# Patient Record
Sex: Male | Born: 1975 | Race: Black or African American | Hispanic: No | Marital: Married | Smoking: Never smoker
Health system: Southern US, Community
[De-identification: ages and names within clinical notes are randomized; demographics above are authoritative.]

## PROBLEM LIST (undated history)

## (undated) DIAGNOSIS — G4733 Obstructive sleep apnea (adult) (pediatric): Secondary | ICD-10-CM

## (undated) DIAGNOSIS — F32A Depression, unspecified: Secondary | ICD-10-CM

## (undated) DIAGNOSIS — F329 Major depressive disorder, single episode, unspecified: Secondary | ICD-10-CM

## (undated) DIAGNOSIS — G43909 Migraine, unspecified, not intractable, without status migrainosus: Secondary | ICD-10-CM

## (undated) DIAGNOSIS — G43109 Migraine with aura, not intractable, without status migrainosus: Principal | ICD-10-CM

## (undated) DIAGNOSIS — I10 Essential (primary) hypertension: Secondary | ICD-10-CM

## (undated) HISTORY — PX: SKIN BIOPSY: SHX1

## (undated) HISTORY — DX: Obstructive sleep apnea (adult) (pediatric): G47.33

## (undated) HISTORY — DX: Migraine with aura, not intractable, without status migrainosus: G43.109

## (undated) HISTORY — PX: SHOULDER SURGERY: SHX246

---

## 2009-05-01 ENCOUNTER — Inpatient Hospital Stay (HOSPITAL_COMMUNITY): Admission: EM | Admit: 2009-05-01 | Discharge: 2009-05-04 | Payer: Self-pay | Admitting: Emergency Medicine

## 2009-05-02 ENCOUNTER — Ambulatory Visit: Payer: Self-pay | Admitting: Infectious Diseases

## 2011-03-12 LAB — CBC
HCT: 37.2 % — ABNORMAL LOW (ref 39.0–52.0)
Hemoglobin: 12.4 g/dL — ABNORMAL LOW (ref 13.0–17.0)
MCHC: 33.2 g/dL (ref 30.0–36.0)
MCV: 87.3 fL (ref 78.0–100.0)
Platelets: 243 10*3/uL (ref 150–400)
RBC: 4.26 MIL/uL (ref 4.22–5.81)
RDW: 13.4 % (ref 11.5–15.5)
WBC: 11.8 10*3/uL — ABNORMAL HIGH (ref 4.0–10.5)

## 2011-03-12 LAB — DIFFERENTIAL
Basophils Absolute: 0.1 10*3/uL (ref 0.0–0.1)
Basophils Relative: 1 % (ref 0–1)
Eosinophils Absolute: 0.2 10*3/uL (ref 0.0–0.7)
Eosinophils Relative: 2 % (ref 0–5)
Lymphocytes Relative: 20 % (ref 12–46)
Lymphs Abs: 2.3 10*3/uL (ref 0.7–4.0)
Monocytes Absolute: 0.8 10*3/uL (ref 0.1–1.0)
Monocytes Relative: 7 % (ref 3–12)
Neutro Abs: 8.4 10*3/uL — ABNORMAL HIGH (ref 1.7–7.7)
Neutrophils Relative %: 71 % (ref 43–77)

## 2011-03-12 LAB — GC/CHLAMYDIA PROBE AMP, URINE
Chlamydia, Swab/Urine, PCR: NEGATIVE
GC Probe Amp, Urine: NEGATIVE

## 2011-03-12 LAB — HIV 1/2 CONFIRMATION
HIV-1 antibody: NEGATIVE
HIV-2 Ab: NEGATIVE

## 2011-03-12 LAB — VANCOMYCIN, TROUGH: Vancomycin Tr: 15.7 ug/mL (ref 10.0–20.0)

## 2011-03-13 LAB — CBC
HCT: 41.2 % (ref 39.0–52.0)
Hemoglobin: 13.5 g/dL (ref 13.0–17.0)
MCHC: 32.8 g/dL (ref 30.0–36.0)
MCV: 85.3 fL (ref 78.0–100.0)
Platelets: 244 10*3/uL (ref 150–400)
RBC: 4.83 MIL/uL (ref 4.22–5.81)
RDW: 12.4 % (ref 11.5–15.5)
WBC: 13.4 10*3/uL — ABNORMAL HIGH (ref 4.0–10.5)

## 2011-03-13 LAB — CULTURE, BLOOD (ROUTINE X 2): Culture: NO GROWTH

## 2011-03-13 LAB — DIFFERENTIAL
Basophils Absolute: 0.3 10*3/uL — ABNORMAL HIGH (ref 0.0–0.1)
Basophils Relative: 2 % — ABNORMAL HIGH (ref 0–1)
Eosinophils Absolute: 0.1 10*3/uL (ref 0.0–0.7)
Eosinophils Relative: 1 % (ref 0–5)
Lymphocytes Relative: 16 % (ref 12–46)
Lymphs Abs: 2.2 10*3/uL (ref 0.7–4.0)
Monocytes Absolute: 1.1 10*3/uL — ABNORMAL HIGH (ref 0.1–1.0)
Monocytes Relative: 8 % (ref 3–12)
Neutro Abs: 9.7 10*3/uL — ABNORMAL HIGH (ref 1.7–7.7)
Neutrophils Relative %: 72 % (ref 43–77)

## 2011-03-13 LAB — SEDIMENTATION RATE: Sed Rate: 8 mm/hr (ref 0–16)

## 2011-03-13 LAB — BODY FLUID CULTURE: Culture: NO GROWTH

## 2011-03-13 LAB — BASIC METABOLIC PANEL
BUN: 8 mg/dL (ref 6–23)
CO2: 32 mEq/L (ref 19–32)
Calcium: 9.1 mg/dL (ref 8.4–10.5)
Chloride: 98 mEq/L (ref 96–112)
Creatinine, Ser: 0.9 mg/dL (ref 0.4–1.5)
GFR calc Af Amer: >60 mL/min (ref >60)
GFR calc non Af Amer: >60 mL/min (ref >60)
Glucose, Bld: 102 mg/dL — ABNORMAL HIGH (ref 70–99)
Potassium: 3.8 mEq/L (ref 3.5–5.1)
Sodium: 142 mEq/L (ref 135–145)

## 2011-03-13 LAB — SYNOVIAL CELL COUNT + DIFF, W/ CRYSTALS
Crystals, Fluid: NONE SEEN
Lymphocytes-Synovial Fld: 0 % (ref 0–20)
Monocyte-Macrophage-Synovial Fluid: 2 % — ABNORMAL LOW (ref 50–90)
Neutrophil, Synovial: 98 % — ABNORMAL HIGH (ref 0–25)
WBC, Synovial: 34000 /mm3 — ABNORMAL HIGH (ref 0–200)

## 2011-03-13 LAB — ANAEROBIC CULTURE

## 2011-04-17 NOTE — H&P (Signed)
NAME:  INEZ, ROSATO NO.:  [id number]   MEDICAL RECORD NO.:  [id number]          PATIENT TYPE:  EMS   LOCATION:  ED                           FACILITY:  [facility]   PHYSICIAN:  Burnard Bunting, M.D.    DATE OF BIRTH:  1976-01-20   DATE OF ADMISSION:  05/01/2009  DATE OF DISCHARGE:                              HISTORY & PHYSICAL   CHIEF COMPLAINT:  Right shoulder pain.   HISTORY OF PRESENT ILLNESS:  Johnathan Steele is a 35 year old right-  hand-dominant patient with a 2-day history of right shoulder pain and  several day history of some tooth pain on the left-hand side.  He denies  any fever or chills or trauma to the right shoulder region.  He  describes a gradually increasing pain in the right shoulder region which  has become more severe.  He denies any history of gout.  He states that  the shoulder range of motion although painful is possible.   PAST MEDICAL HISTORY:  Known for sleep apnea.   PAST SURGICAL HISTORY:  None.   The patient is nonsmoker, nondrinker.  No history of drug abuse.  He has  had his tetanus within 5 years.   HE HAS NO KNOWN DRUG ALLERGIES.   He was seen over at the [facility] where Toradol and  Ancef was given.  Fourteen other systems are reviewed and negative.  He  is a Psychologist, occupational.  He is married and has a daughter.   EXAM:  He is well-developed, well-nourished, no acute distress.  Alert  and oriented.  Blood pressure 140/74, pulse is 90, respirations 16,  temperature is 98.2.  CHEST:  Clear to auscultation.  HEART:  Regular rate and rhythm.  ABDOMINAL:  Benign.  RIGHT SHOULDER:  Demonstrates about a 10 cm diameter area of cellulitis  over the anterolateral aspect of the acromion.  There is no AC joint  tenderness.  Shoulder range of motion is mildly tender, rotator cuff  strength is intact, radial pulse 2+ out of 4.  There is no cervical  lymphadenopathy.  NECK:  Range of motion is full.  No tissue crepitus or  scapular  dyskinesia.   His white count is 13,000, this was done also at [facility].  Plain x-  rays are unremarkable.  MRI scan shows soft tissue edema surrounding the  shoulder compatible with cellulitis, no evidence of deep abscess.  There  is a small to moderate shoulder joint effusion which is nonspecific with  mild capsular extravasation anteriorly along the scapular body.  No  intra-articular pathology is evident.  The scan is somewhat limited  because of the patient's large size.  There is no history of gout in  this patient.   IMPRESSION:  Possible right shoulder infection.   PLAN:  Ancef was given at the [facility], this will  adversely affect our ability to obtain positive cultures.  Nonetheless  he does have a white count, shoulder effusion and cellulitis in the  anterior aspect the shoulder.  Plan at this time is for shoulder  arthroscopy and debridement of the  intra-articular space.  I believe  that the posterior  and anterior portals can be achieved without going through the  cellulitic area.  Risks and benefits of the surgery are discussed with  the patient including possible long-term adverse outcome of the shoulder  joint if infection persists.  All other questions are answered.  Plan on  diagnostic  __________ arthroscopy with ID consult.      Burnard Bunting, M.D.  Electronically Signed     GSD/MEDQ  D:  05/01/2009  T:  05/01/2009  Job:  [id number]

## 2011-04-17 NOTE — Op Note (Signed)
Johnathan Steele, Johnathan Steele          ACCOUNT NO.:  [id number]   MEDICAL RECORD NO.:  [id number]          PATIENT TYPE:  INP   LOCATION:  1539                         FACILITY:  [facility]   PHYSICIAN:  Burnard Bunting, M.D.    DATE OF BIRTH:  1976/01/22   DATE OF PROCEDURE:  05/01/2009  DATE OF DISCHARGE:                               OPERATIVE REPORT   PREOPERATIVE DIAGNOSIS:  Right shoulder infection.   POSTOPERATIVE DIAGNOSIS:  Right shoulder infection.   PROCEDURE:  Right shoulder arthroscopy and debridement.   SURGEON ATTENDING:  Burnard Bunting, MD.   ASSISTANT:  None.   ANESTHESIA:  General.   INDICATIONS:  Less Woolsey is a 35 year old patient with a  traumatic onset of right shoulder pain with a white count of 13,000 and  a moderate fluid collection in the shoulder joint by MRI scanning.  He  presents now for operative management.   OPERATIVE FINDINGS:  Examination under anesthesia:  The patient had a  pretty full range of motion.   DIAGNOSTIC ARTHROSCOPY:  1. Cloudy fluid within the glenohumeral joint.  2. Synovitis within the rotator interval.  3. Intact rotator cuff, articular cartilage, and biceps tendon.   PROCEDURE IN DETAIL:  The patient was brought to operating room where  general anesthesia was induced.  The patient was placed in the beach  chair position with the head in neutral position, right arm and shoulder  were prepped with Hibiclens and saline and draped in a sterile manner.   The anatomy of the shoulder was identified and seen to pressure lie on  the anterior margin of the acromion.  The scope was placed in through  the posterior portal into the glenohumeral joint.  Cloudy fluid was  encountered and this was sent for aerobic and anaerobic culture, gram  stain, cell count, and crystals.  The anterior portal was then created  under direct visualization and diagnostic arthroscopy was performed.  Cloudy fluid was again encountered within the  glenohumeral joint.  The  rotator cuff and biceps tendon were intact.  Synovitis was present in  the rotator interval and this was debrided.  The glenohumeral articular  surfaces were otherwise intact.  Six liters of irrigating solution  were placed through the joint.  A Hemovac drain was then placed after  debridement and irrigation.  This drain was placed through the anterior  portal.  The portals were then closed using 3-0 nylon.  Dressings and a  sling were applied.  The patient tolerated well without any immediate  complication.      Burnard Bunting, M.D.  Electronically Signed     GSD/MEDQ  D:  05/01/2009  T:  05/01/2009  Job:  [id number]

## 2011-04-20 NOTE — Discharge Summary (Signed)
[name], Johnathan Steele          ACCOUNT NO.:  [id number]   MEDICAL RECORD NO.:  [id number]          PATIENT TYPE:  INP   LOCATION:  1539                         [facility]   PHYSICIAN:  Burnard Bunting, M.D.    DATE OF BIRTH:  1976/03/29   DATE OF ADMISSION:  05/01/2009  DATE OF DISCHARGE:  05/04/2009                               DISCHARGE SUMMARY   DISCHARGE DIAGNOSIS:  Right shoulder infection.   SECONDARY DIAGNOSIS:  Hypertension.   OPERATIONS AND PROCEDURES:  Right shoulder arthroscopy and debridement  performed May 01, 2009.   HOSPITAL COURSE:  Merland Holness is a 35 year old patient with  spontaneous infection of the right shoulder.  He was admitted to the  orthopedic service May 01, 2009, at that time aspiration of the shoulder  joint.  At that time the patient had white count and effusion in the  shoulder consistent with infection.  He was admitted to the orthopedic  service, taken to the OR where right shoulder arthroscopy and  debridement was performed.  He was seen by infectious disease.  Cultures  showed Gram-positive cocci consistent with probable Staph aureus.  PICC  line was placed.  ID recommended 4 weeks of IV vancomycin which was  started.  He was seen by physical therapy, started pendulum exercises.  Incisions were intact at the time of discharge.  He otherwise had an  unremarkable recovery.  He was discharged home in good condition May 04, 2009.   DISCHARGE MEDICATIONS:  1. Vancomycin IV per pharmacy protocol.  2. Percocet 5/325 one p.o. q.3 - 4 hours p.r.n. pain.  3. Hydrochlorothiazide. 25 mg p.o. daily as treatment of blood      pressure for hypertension.   The patient will follow up with me in 7 days for suture removal.  He is  discharged in good condition.      Burnard Bunting, M.D.  Electronically Signed     GSD/MEDQ  D:  06/30/2009  T:  06/30/2009  Job:  [id number]

## 2011-09-28 ENCOUNTER — Emergency Department (HOSPITAL_BASED_OUTPATIENT_CLINIC_OR_DEPARTMENT_OTHER)
Admission: EM | Admit: 2011-09-28 | Discharge: 2011-09-28 | Disposition: A | Discharge status: Home / Self Care | Payer: Self-pay | Attending: Emergency Medicine | Admitting: Emergency Medicine

## 2011-09-28 ENCOUNTER — Encounter: Payer: Self-pay | Admitting: Family Medicine

## 2011-09-28 DIAGNOSIS — K089 Disorder of teeth and supporting structures, unspecified: Secondary | ICD-10-CM | POA: Insufficient documentation

## 2011-09-28 DIAGNOSIS — I10 Essential (primary) hypertension: Secondary | ICD-10-CM | POA: Insufficient documentation

## 2011-09-28 DIAGNOSIS — K0889 Other specified disorders of teeth and supporting structures: Secondary | ICD-10-CM

## 2011-09-28 DIAGNOSIS — Z79899 Other long term (current) drug therapy: Secondary | ICD-10-CM | POA: Insufficient documentation

## 2011-09-28 HISTORY — DX: Migraine, unspecified, not intractable, without status migrainosus: G43.909

## 2011-09-28 HISTORY — DX: Essential (primary) hypertension: I10

## 2011-09-28 MED ORDER — PENICILLIN V POTASSIUM 500 MG PO TABS: 500.0000 mg | ORAL_TABLET | Freq: Three times a day (TID) | ORAL | Status: AC

## 2011-09-28 MED ORDER — HYDROCODONE-ACETAMINOPHEN 5-325 MG PO TABS: 2.0000 | ORAL_TABLET | ORAL | Status: AC | PRN

## 2011-09-28 NOTE — ED Notes (Signed)
MD at bedside. 

## 2011-09-28 NOTE — ED Provider Notes (Signed)
History     CSN: [id number] Arrival date & time: 09/28/2011  9:23 AM   First MD Initiated Contact with Patient 09/28/11 0935      Chief Complaint  Patient presents with  . Dental Pain    (Consider location/radiation/quality/duration/timing/severity/associated sxs/prior treatment) HPI Complains of toothache left side upper first premolar tooth for the company swelling around the tooth onset 2 days ago treated with ibuprofen no relief pain is nonradiating sharp no other associated symptoms Past Medical History  Diagnosis Date  . Hypertension   . Migraines     Past Surgical History  Procedure Date  . Shoulder surgery   . Skin biopsy     No family history on file.  History  Substance Use Topics  . Smoking status: Never Smoker   . Smokeless tobacco: Not on file  . Alcohol Use: No      Review of Systems  Constitutional: Negative.   HENT: Negative.        Toothache  Respiratory: Negative.   Cardiovascular: Negative.   Gastrointestinal: Negative.   Musculoskeletal: Negative.   Skin: Negative.   Neurological: Negative.   Hematological: Negative.   Psychiatric/Behavioral: Negative.     Allergies  Review of patient's allergies indicates no known allergies.  Home Medications   Current Outpatient Rx  Name Route Sig Dispense Refill  . HYDROCHLOROTHIAZIDE 25 MG PO TABS Oral Take 25 mg by mouth daily.      . IBUPROFEN 200 MG PO TABS Oral Take 400 mg by mouth every 8 (eight) hours as needed. For headache     . METOPROLOL TARTRATE 100 MG PO TABS Oral Take 100 mg by mouth 2 (two) times daily.      . TOPIRAMATE 100 MG PO TABS Oral Take 100 mg by mouth daily.      . TOPIRAMATE 100 MG PO TABS Oral Take 200 mg by mouth at bedtime.      . TRAMADOL HCL 50 MG PO TABS Oral Take 50 mg by mouth every 6 (six) hours as needed. Maximum dose= 8 tablets per day    For migrains       BP 140/81  Pulse 84  Temp(Src) 97.9 F (36.6 C) (Oral)  Resp 16  Ht 6\' 1"  (1.854 m)  Wt 320 lb  (145.151 kg)  BMI 42.22 kg/m2  SpO2 100%  Physical Exam  Nursing note and vitals reviewed. Constitutional: He appears well-developed and well-nourished.  HENT:  Head: Normocephalic and atraumatic.       No gingival swelling or fluctuance. No trismus  Eyes: Conjunctivae are normal. Pupils are equal, round, and reactive to light.  Neck: Neck supple. No tracheal deviation present. No thyromegaly present.  Cardiovascular: Normal rate and regular rhythm.   No murmur heard. Pulmonary/Chest: Effort normal and breath sounds normal.  Abdominal: Soft. Bowel sounds are normal. He exhibits no distension. There is no tenderness.  Musculoskeletal: Normal range of motion. He exhibits no edema and no tenderness.  Neurological: He is alert. Coordination normal.  Skin: Skin is warm and dry. No rash noted.  Psychiatric: He has a normal mood and affect.    ED Course  Procedures (including critical care time)  Labs Reviewed - No data to display No results found.   No diagnosis found.    MDM  Plan prescription hydrocodone-A. Pap, Pen-Vee K dental followup next week blood pressure rechecked within 3 weeks Diagnosis #1 toothache #2 hypertension     Doug Sou, MD 09/28/11 1106

## 2011-09-28 NOTE — ED Notes (Signed)
Pt c/o toothache to left upper and left facial pain x 2 days.

## 2011-10-11 ENCOUNTER — Emergency Department (HOSPITAL_BASED_OUTPATIENT_CLINIC_OR_DEPARTMENT_OTHER)
Admission: EM | Admit: 2011-10-11 | Discharge: 2011-10-11 | Disposition: A | Discharge status: Home / Self Care | Payer: Self-pay | Attending: Emergency Medicine | Admitting: Emergency Medicine

## 2011-10-11 ENCOUNTER — Encounter (HOSPITAL_BASED_OUTPATIENT_CLINIC_OR_DEPARTMENT_OTHER): Payer: Self-pay | Admitting: Family Medicine

## 2011-10-11 ENCOUNTER — Other Ambulatory Visit: Payer: Self-pay

## 2011-10-11 DIAGNOSIS — Z79899 Other long term (current) drug therapy: Secondary | ICD-10-CM | POA: Insufficient documentation

## 2011-10-11 DIAGNOSIS — R42 Dizziness and giddiness: Secondary | ICD-10-CM | POA: Insufficient documentation

## 2011-10-11 DIAGNOSIS — R112 Nausea with vomiting, unspecified: Secondary | ICD-10-CM | POA: Insufficient documentation

## 2011-10-11 DIAGNOSIS — R51 Headache: Secondary | ICD-10-CM | POA: Insufficient documentation

## 2011-10-11 LAB — CBC
HCT: 44.1 % (ref 39.0–52.0)
Hemoglobin: 14.7 g/dL (ref 13.0–17.0)
MCH: 27.8 pg (ref 26.0–34.0)
MCHC: 33.3 g/dL (ref 30.0–36.0)
MCV: 83.5 fL (ref 78.0–100.0)
Platelets: 295 10*3/uL (ref 150–400)
RBC: 5.28 MIL/uL (ref 4.22–5.81)
RDW: 13.3 % (ref 11.5–15.5)
WBC: 10 10*3/uL (ref 4.0–10.5)

## 2011-10-11 LAB — URINALYSIS, ROUTINE W REFLEX MICROSCOPIC
Bilirubin Urine: NEGATIVE
Glucose, UA: NEGATIVE mg/dL
Hgb urine dipstick: NEGATIVE
Ketones, ur: NEGATIVE mg/dL
Leukocytes, UA: NEGATIVE
Nitrite: NEGATIVE
Protein, ur: 30 mg/dL — AB
Specific Gravity, Urine: 1.016 (ref 1.005–1.030)
Urobilinogen, UA: 0.2 mg/dL (ref 0.0–1.0)
pH: 8 (ref 5.0–8.0)

## 2011-10-11 LAB — COMPREHENSIVE METABOLIC PANEL
ALT: 81 U/L — ABNORMAL HIGH (ref 0–53)
AST: 54 U/L — ABNORMAL HIGH (ref 0–37)
Albumin: 4.3 g/dL (ref 3.5–5.2)
Alkaline Phosphatase: 90 U/L (ref 39–117)
BUN: 7 mg/dL (ref 6–23)
CO2: 26 mEq/L (ref 19–32)
Calcium: 10.1 mg/dL (ref 8.4–10.5)
Chloride: 101 mEq/L (ref 96–112)
Creatinine, Ser: 0.7 mg/dL (ref 0.50–1.35)
GFR calc Af Amer: >90 mL/min (ref >90)
GFR calc non Af Amer: >90 mL/min (ref >90)
Glucose, Bld: 105 mg/dL — ABNORMAL HIGH (ref 70–99)
Potassium: 4.1 mEq/L (ref 3.5–5.1)
Sodium: 138 mEq/L (ref 135–145)
Total Bilirubin: 0.3 mg/dL (ref 0.3–1.2)
Total Protein: 8.3 g/dL (ref 6.0–8.3)

## 2011-10-11 LAB — URINE MICROSCOPIC-ADD ON

## 2011-10-11 NOTE — ED Provider Notes (Signed)
History     CSN: [id number] Arrival date & time: 10/11/2011  4:24 PM   First MD Initiated Contact with Patient 10/11/11 1628      Chief Complaint  Patient presents with  . Dizziness    (Consider location/radiation/quality/duration/timing/severity/associated sxs/prior treatment) HPI He notes the subacute onset of nausea this morning, approximately 8 hours prior to presentation. Since onset the patient has been mildly nauseous and lightheaded (here syncopal). He notes that he had a mild headache, consistent with prior migraines as well, but this has resolved following Topamax and by mouth antihypertensives. He denies any focal weakness, dysesthesia, nausea, vomiting, pain, dyspnea, disorientation. No clear alleviating, or exacerbating factors.    Past Surgical History  Procedure Date  . Shoulder surgery   . Skin biopsy     No family history on file.  History  Substance Use Topics  . Smoking status: Never Smoker   . Smokeless tobacco: Not on file  . Alcohol Use: No      Review of Systems Gen: Per HPI HEENT: hpi CV: No CP Resp: No dyspnea Abd: Per HPI, otherwise negative Musk: Per HPI, otherwise negative Neuro: No dysesthesia, or focal changes GU: Per HPI, otherwise negative Skin: Neg Psych: Neg  Allergies  Review of patient's allergies indicates no known allergies.  Home Medications   Current Outpatient Rx  Name Route Sig Dispense Refill  . CARISOPRODOL 350 MG PO TABS Oral Take 350 mg by mouth 4 (four) times daily as needed. For muscle spasms    . HYDROCHLOROTHIAZIDE 25 MG PO TABS Oral Take 25 mg by mouth daily.      Marland Kitchen METOPROLOL TARTRATE 100 MG PO TABS Oral Take 100 mg by mouth 2 (two) times daily.      . TOPIRAMATE 100 MG PO TABS Oral Take 100 mg by mouth daily.      . TOPIRAMATE 100 MG PO TABS Oral Take 200 mg by mouth at bedtime.      . TRAMADOL HCL 50 MG PO TABS Oral Take 50 mg by mouth every 6 (six) hours as needed. For migraines.Maximum dose= 8 tablets  per day      BP 131/83  Pulse 60  Temp(Src) 97.9 F (36.6 C) (Oral)  Resp 16  SpO2 97%  Physical Exam  Constitutional: He is oriented to person, place, and time. He appears well-developed and well-nourished.  HENT:  Head: Normocephalic and atraumatic.  Eyes: Conjunctivae are normal. Pupils are equal, round, and reactive to light.  Neck: Neck supple.  Cardiovascular: Normal rate and regular rhythm.   Pulmonary/Chest: No respiratory distress.  Abdominal: Soft. There is no tenderness.  Musculoskeletal: He exhibits no edema.  Neurological: He is alert and oriented to person, place, and time. A cranial nerve deficit is present. He exhibits normal muscle tone. Coordination normal.  Skin: Skin is warm and dry.  Psychiatric: He has a normal mood and affect.    ED Course  Procedures (including critical care time)  Labs Reviewed  COMPREHENSIVE METABOLIC PANEL - Abnormal; Notable for the following:    Glucose, Bld 105 (*)    AST 54 (*)    ALT 81 (*)    All other components within normal limits  URINALYSIS, ROUTINE W REFLEX MICROSCOPIC - Abnormal; Notable for the following:    Protein, ur 30 (*)    All other components within normal limits  CBC  URINE MICROSCOPIC-ADD ON   No results found.   No diagnosis found.   Date: 10/11/2011  Rate: 72  Rhythm: normal sinus rhythm  QRS Axis: normal  Intervals: normal  ST/T Wave abnormalities: normal  Conduction Disutrbances:none  Narrative Interpretation:   Old EKG Reviewed: none available    MDM  This well-appearing 35 year old male with history of hypertension and migraines now presents with 8 hours of dizziness, and mild nausea. On exam patient is in no distress and I exam he has no focal findings. Patient's labs are notable for mild proteinuria, otherwise not particularly notable. The patient's vital signs  Abdomen stable, with no significant hypertension. Absent acute findings, and with the patient's benign physical exam she is  going to be discharged. The patient notes that he has a primary care physician and will contact him tomorrow to continue evaluation of this illness, which is consistent with a viral syndrome or atypical migraine. Exposer return precautions were also provided the patient, during a discussion on the possibility of this being early in an acute illness.         Gerhard Munch, MD 10/11/11 1904

## 2011-10-11 NOTE — ED Notes (Signed)
Clear speech with no weakness from L to R.  Pt. Pupils are 3mm bilat with brisk reaction.  Pt. Reports he feels better now than on arrival.  Pt. Drove himself here.

## 2011-10-11 NOTE — ED Notes (Signed)
Pt c/o "migraine today and feeling nauseous and dizzy since about 10am". Pt sts he took normal bp and migraine medicine and "felt worse instead of better". Pt alert and oriented.  No facial droop noted, equal grip strength. Pt drove self to ED.

## 2011-10-11 NOTE — ED Notes (Signed)
Pt. In no distress and will be discharged

## 2012-03-12 ENCOUNTER — Other Ambulatory Visit: Payer: Self-pay

## 2012-03-12 ENCOUNTER — Emergency Department (HOSPITAL_BASED_OUTPATIENT_CLINIC_OR_DEPARTMENT_OTHER)
Admission: EM | Admit: 2012-03-12 | Discharge: 2012-03-12 | Disposition: A | Discharge status: Home / Self Care | Payer: Self-pay | Attending: Emergency Medicine | Admitting: Emergency Medicine

## 2012-03-12 ENCOUNTER — Encounter (HOSPITAL_BASED_OUTPATIENT_CLINIC_OR_DEPARTMENT_OTHER): Payer: Self-pay

## 2012-03-12 DIAGNOSIS — R739 Hyperglycemia, unspecified: Secondary | ICD-10-CM

## 2012-03-12 DIAGNOSIS — H538 Other visual disturbances: Secondary | ICD-10-CM | POA: Insufficient documentation

## 2012-03-12 DIAGNOSIS — Z79899 Other long term (current) drug therapy: Secondary | ICD-10-CM | POA: Insufficient documentation

## 2012-03-12 DIAGNOSIS — R51 Headache: Secondary | ICD-10-CM | POA: Insufficient documentation

## 2012-03-12 DIAGNOSIS — R350 Frequency of micturition: Secondary | ICD-10-CM | POA: Insufficient documentation

## 2012-03-12 DIAGNOSIS — I10 Essential (primary) hypertension: Secondary | ICD-10-CM | POA: Insufficient documentation

## 2012-03-12 DIAGNOSIS — E119 Type 2 diabetes mellitus without complications: Secondary | ICD-10-CM | POA: Insufficient documentation

## 2012-03-12 DIAGNOSIS — R5381 Other malaise: Secondary | ICD-10-CM | POA: Insufficient documentation

## 2012-03-12 LAB — CBC
HCT: 42.1 % (ref 39.0–52.0)
Hemoglobin: 15.2 g/dL (ref 13.0–17.0)
MCH: 28.6 pg (ref 26.0–34.0)
MCHC: 36.1 g/dL — ABNORMAL HIGH (ref 30.0–36.0)
MCV: 79.1 fL (ref 78.0–100.0)
Platelets: 236 10*3/uL (ref 150–400)
RBC: 5.32 MIL/uL (ref 4.22–5.81)
RDW: 12.1 % (ref 11.5–15.5)
WBC: 8.9 10*3/uL (ref 4.0–10.5)

## 2012-03-12 LAB — BASIC METABOLIC PANEL
BUN: 14 mg/dL (ref 6–23)
BUN: 11 mg/dL (ref 6–23)
BUN: 10 mg/dL (ref 6–23)
CO2: 21 mEq/L (ref 19–32)
CO2: 18 mEq/L — ABNORMAL LOW (ref 19–32)
CO2: 20 mEq/L (ref 19–32)
Calcium: 9.6 mg/dL (ref 8.4–10.5)
Calcium: 9 mg/dL (ref 8.4–10.5)
Calcium: 8.7 mg/dL (ref 8.4–10.5)
Chloride: 89 mEq/L — ABNORMAL LOW (ref 96–112)
Chloride: 96 mEq/L (ref 96–112)
Chloride: 98 mEq/L (ref 96–112)
Creatinine, Ser: 0.8 mg/dL (ref 0.50–1.35)
Creatinine, Ser: 0.6 mg/dL (ref 0.50–1.35)
Creatinine, Ser: 0.7 mg/dL (ref 0.50–1.35)
GFR calc Af Amer: >90 mL/min (ref >90)
GFR calc Af Amer: >90 mL/min (ref >90)
GFR calc Af Amer: >90 mL/min (ref >90)
GFR calc non Af Amer: >90 mL/min (ref >90)
GFR calc non Af Amer: >90 mL/min (ref >90)
GFR calc non Af Amer: >90 mL/min (ref >90)
Glucose, Bld: 563 mg/dL (ref 70–99)
Glucose, Bld: 344 mg/dL — ABNORMAL HIGH (ref 70–99)
Glucose, Bld: 257 mg/dL — ABNORMAL HIGH (ref 70–99)
Potassium: 4 mEq/L (ref 3.5–5.1)
Potassium: 3.8 mEq/L (ref 3.5–5.1)
Potassium: 3.6 mEq/L (ref 3.5–5.1)
Sodium: 129 mEq/L — ABNORMAL LOW (ref 135–145)
Sodium: 132 mEq/L — ABNORMAL LOW (ref 135–145)
Sodium: 133 mEq/L — ABNORMAL LOW (ref 135–145)

## 2012-03-12 LAB — DIFFERENTIAL
Basophils Absolute: 0 10*3/uL (ref 0.0–0.1)
Basophils Relative: 0 % (ref 0–1)
Eosinophils Absolute: 0.1 10*3/uL (ref 0.0–0.7)
Eosinophils Relative: 1 % (ref 0–5)
Lymphocytes Relative: 26 % (ref 12–46)
Lymphs Abs: 2.3 10*3/uL (ref 0.7–4.0)
Monocytes Absolute: 0.6 10*3/uL (ref 0.1–1.0)
Monocytes Relative: 7 % (ref 3–12)
Neutro Abs: 5.9 10*3/uL (ref 1.7–7.7)
Neutrophils Relative %: 66 % (ref 43–77)

## 2012-03-12 LAB — GLUCOSE, CAPILLARY
Glucose-Capillary: 584 mg/dL (ref 70–99)
Glucose-Capillary: 421 mg/dL — ABNORMAL HIGH (ref 70–99)
Glucose-Capillary: 351 mg/dL — ABNORMAL HIGH (ref 70–99)
Glucose-Capillary: 331 mg/dL — ABNORMAL HIGH (ref 70–99)
Glucose-Capillary: 281 mg/dL — ABNORMAL HIGH (ref 70–99)
Glucose-Capillary: 251 mg/dL — ABNORMAL HIGH (ref 70–99)
Glucose-Capillary: 154 mg/dL — ABNORMAL HIGH (ref 70–99)

## 2012-03-12 LAB — URINALYSIS, ROUTINE W REFLEX MICROSCOPIC
Bilirubin Urine: NEGATIVE
Glucose, UA: >1000 mg/dL — AB
Ketones, ur: 40 mg/dL — AB
Leukocytes, UA: NEGATIVE
Nitrite: NEGATIVE
Protein, ur: NEGATIVE mg/dL
Specific Gravity, Urine: 1.036 — ABNORMAL HIGH (ref 1.005–1.030)
Urobilinogen, UA: 0.2 mg/dL (ref 0.0–1.0)
pH: 5 (ref 5.0–8.0)

## 2012-03-12 LAB — URINE MICROSCOPIC-ADD ON

## 2012-03-12 MED ORDER — INSULIN REGULAR HUMAN 100 UNIT/ML IJ SOLN
INTRAMUSCULAR | Status: AC
  Administered 2012-03-12: 5.8 [IU]
  Filled 2012-03-12: qty 1

## 2012-03-12 MED ORDER — SODIUM CHLORIDE 0.9 % IV BOLUS (SEPSIS)
1000.0000 mL | Freq: Once | INTRAVENOUS | Status: AC
  Administered 2012-03-12: 1000 mL via INTRAVENOUS

## 2012-03-12 MED ORDER — SODIUM CHLORIDE 0.9 % IV SOLN
INTRAVENOUS | Status: DC
  Administered 2012-03-12: 3.6 [IU]/h via INTRAVENOUS
  Administered 2012-03-12: 8.8 [IU]/h via INTRAVENOUS

## 2012-03-12 MED ORDER — METFORMIN HCL 1000 MG PO TABS: 1000.0000 mg | ORAL_TABLET | Freq: Two times a day (BID) | ORAL | Status: DC

## 2012-03-12 MED ORDER — SODIUM CHLORIDE 0.9 % IV SOLN: 1000.0000 mL | INTRAVENOUS | Status: DC

## 2012-03-12 NOTE — ED Provider Notes (Addendum)
History     CSN: [id number]  Arrival date & time 03/12/12  0806   First MD Initiated Contact with Patient 03/12/12 [phone]      Chief Complaint  Patient presents with  . Headache  . Blurred Vision  . Urinary Frequency    (Consider location/radiation/quality/duration/timing/severity/associated sxs/prior treatment) HPI Comments:  Patient presents with vague symptoms of mild headache, blurred vision and fatigue specifically noticed over the last week.  Patient notes that he's been urinating more frequently.  He notes that baseline he drinks plenty of water so has not noted a specific increase in thirst.  No abdominal pain, fevers, nausea, diarrhea or other recent illnesses.  Patient has no prior history of diabetes.  Patient is noted some vague symptoms of dizziness but no syncopal episodes.  No chest pain or shortness of breath.  No specific inciting or relieving factors for his symptoms.  Patient is a 36 y.o. male presenting with headaches and frequency. The history is provided by the patient. No language interpreter was used.  Headache  This is a new problem. The current episode started more than 2 days ago. Pertinent negatives include no fever, no shortness of breath, no nausea and no vomiting.  Urinary Frequency Associated symptoms include headaches. Pertinent negatives include no chest pain, no abdominal pain and no shortness of breath.    Past Medical History  Diagnosis Date  . Hypertension   . Migraines   . Migraine     Past Surgical History  Procedure Date  . Shoulder surgery   . Skin biopsy     No family history on file.  History  Substance Use Topics  . Smoking status: Never Smoker   . Smokeless tobacco: Not on file  . Alcohol Use: No      Review of Systems  Constitutional: Positive for fatigue. Negative for fever and chills.  Eyes: Negative.  Negative for discharge and redness.  Respiratory: Negative.  Negative for cough and shortness of breath.     Cardiovascular: Negative.  Negative for chest pain.  Gastrointestinal: Negative.  Negative for nausea, vomiting and abdominal pain.  Genitourinary: Positive for frequency. Negative for hematuria.  Musculoskeletal: Negative.  Negative for back pain.  Skin: Negative.  Negative for color change and rash.  Neurological: Positive for dizziness and headaches. Negative for syncope.  Hematological: Negative.  Negative for adenopathy.  Psychiatric/Behavioral: Negative.  Negative for confusion.  All other systems reviewed and are negative.    Allergies  Review of patient's allergies indicates no known allergies.  Home Medications   Current Outpatient Rx  Name Route Sig Dispense Refill  . CARISOPRODOL 350 MG PO TABS Oral Take 350 mg by mouth 4 (four) times daily as needed. For muscle spasms    . HYDROCHLOROTHIAZIDE 25 MG PO TABS Oral Take 25 mg by mouth daily.      Marland Kitchen METOPROLOL TARTRATE 100 MG PO TABS Oral Take 100 mg by mouth 2 (two) times daily.      . TOPIRAMATE 100 MG PO TABS Oral Take 100 mg by mouth daily.      . TOPIRAMATE 100 MG PO TABS Oral Take 200 mg by mouth at bedtime.      . TRAMADOL HCL 50 MG PO TABS Oral Take 50 mg by mouth every 6 (six) hours as needed. For migraines.Maximum dose= 8 tablets per day      BP 142/95  Pulse 97  Temp(Src) 98.4 F (36.9 C) (Oral)  Resp 18  Ht 6\' 1"  (1.854  m)  Wt 300 lb (136.079 kg)  BMI 39.58 kg/m2  SpO2 97%  Physical Exam  Nursing note and vitals reviewed. Constitutional: He is oriented to person, place, and time. He appears well-developed and well-nourished.  Non-toxic appearance. He does not have a sickly appearance.  HENT:  Head: Normocephalic and atraumatic.  Eyes: Conjunctivae, EOM and lids are normal. Pupils are equal, round, and reactive to light.  Neck: Trachea normal, normal range of motion and full passive range of motion without pain. Neck supple.  Cardiovascular: Normal rate, regular rhythm and normal heart sounds.    Pulmonary/Chest: Effort normal and breath sounds normal. No respiratory distress.  Abdominal: Soft. Normal appearance. He exhibits no distension. There is no tenderness. There is no rebound and no CVA tenderness.  Musculoskeletal: Normal range of motion.  Neurological: He is alert and oriented to person, place, and time. He has normal strength.  Skin: Skin is warm, dry and intact. No rash noted.  Psychiatric: He has a normal mood and affect. His behavior is normal. Judgment and thought content normal.    ED Course  Procedures (including critical care time)  Results for orders placed during the hospital encounter of 03/12/12  GLUCOSE, CAPILLARY      Component Value Range   Glucose-Capillary 584 (*) 70 - 99 (mg/dL)  CBC      Component Value Range   WBC 8.9  4.0 - 10.5 (K/uL)   RBC 5.32  4.22 - 5.81 (MIL/uL)   Hemoglobin 15.2  13.0 - 17.0 (g/dL)   HCT 45.4  09.8 - 11.9 (%)   MCV 79.1  78.0 - 100.0 (fL)   MCH 28.6  26.0 - 34.0 (pg)   MCHC 36.1 (*) 30.0 - 36.0 (g/dL)   RDW 14.7  82.9 - 56.2 (%)   Platelets 236  150 - 400 (K/uL)  DIFFERENTIAL      Component Value Range   Neutrophils Relative 66  43 - 77 (%)   Neutro Abs 5.9  1.7 - 7.7 (K/uL)   Lymphocytes Relative 26  12 - 46 (%)   Lymphs Abs 2.3  0.7 - 4.0 (K/uL)   Monocytes Relative 7  3 - 12 (%)   Monocytes Absolute 0.6  0.1 - 1.0 (K/uL)   Eosinophils Relative 1  0 - 5 (%)   Eosinophils Absolute 0.1  0.0 - 0.7 (K/uL)   Basophils Relative 0  0 - 1 (%)   Basophils Absolute 0.0  0.0 - 0.1 (K/uL)  BASIC METABOLIC PANEL      Component Value Range   Sodium 129 (*) 135 - 145 (mEq/L)   Potassium 4.0  3.5 - 5.1 (mEq/L)   Chloride 89 (*) 96 - 112 (mEq/L)   CO2 21  19 - 32 (mEq/L)   Glucose, Bld 563 (*) 70 - 99 (mg/dL)   BUN 14  6 - 23 (mg/dL)   Creatinine, Ser 1.30  0.50 - 1.35 (mg/dL)   Calcium 9.6  8.4 - 86.5 (mg/dL)   GFR calc non Af Amer >90  >90 (mL/min)   GFR calc Af Amer >90  >90 (mL/min)  URINALYSIS, ROUTINE W REFLEX  MICROSCOPIC      Component Value Range   Color, Urine YELLOW  YELLOW    APPearance CLEAR  CLEAR    Specific Gravity, Urine 1.036 (*) 1.005 - 1.030    pH 5.0  5.0 - 8.0    Glucose, UA >1000 (*) NEGATIVE (mg/dL)   Hgb urine dipstick TRACE (*) NEGATIVE  Bilirubin Urine NEGATIVE  NEGATIVE    Ketones, ur 40 (*) NEGATIVE (mg/dL)   Protein, ur NEGATIVE  NEGATIVE (mg/dL)   Urobilinogen, UA 0.2  0.0 - 1.0 (mg/dL)   Nitrite NEGATIVE  NEGATIVE    Leukocytes, UA NEGATIVE  NEGATIVE   URINE MICROSCOPIC-ADD ON      Component Value Range   Squamous Epithelial / LPF RARE  RARE    WBC, UA 0-2  <3 (WBC/hpf)   RBC / HPF 0-2  <3 (RBC/hpf)   Bacteria, UA RARE  RARE        MDM  Patient with hyperglycemia here and diagnosis of new onset diabetes.  Patient has mild DKA with an anion gap of 19 and minimal ketones in his urine.  I believe that with continued hydration and some insulin will be able to reverse this process here in the emergency department.  Patient will require the beginning of oral therapy with metformin and followup with a primary care physician.  I will refer the patient back to his previous primary care physician and to [facility] community care network to be used as needed.  I will advise the patient at [facility] also has diabetic medications for free.        Nat Christen, MD 03/12/12 [phone]  Patient placed on the hyperglycemia protocol.  This includes a glucose stabilizer insulin drip.  Patient has been monitored for many hours now with continued hydration.  His glucose is decreasing.  We are checking repeat BMPs to assess for further reduction in his anion gap.  We also contacted the case management nurse at [facility] for assistance in getting this patient educational information and resources.  She actually came here to the emergency department and brought the patient a multitude of resources and information and also spoke with him.  Patient understands importance of primary  care followup as well.  I anticipate the patient's discharge and we will start him on metformin 1000 mg twice a day as an initial medication since we do not know how much glucose control this patient will need.  CRITICAL CARE Performed by: Emeline General A   Total critical care time: 56 minutes  Critical care time was exclusive of separately billable procedures and treating other patients.  Critical care was necessary to treat or prevent imminent or life-threatening deterioration.  Critical care was time spent personally by me on the following activities: development of treatment plan with patient and/or surrogate as well as nursing, discussions with consultants, evaluation of patient's response to treatment, examination of patient, obtaining history from patient or surrogate, ordering and performing treatments and interventions, ordering and review of laboratory studies, ordering and review of radiographic studies, pulse oximetry and re-evaluation of patient's condition.   Nat Christen, MD 03/12/12 [phone]

## 2012-03-12 NOTE — Progress Notes (Signed)
Inpatient Diabetes Program Recommendations  AACE/ADA: New Consensus Statement on Inpatient Glycemic Control (2009)  Target Ranges:  Prepandial:   less than 140 mg/dL      Peak postprandial:   less than 180 mg/dL (1-2 hours)      Critically ill patients:  140 - 180 mg/dL   Reason for Visit:   New-onset Diabetes Mellitus.  Basic survival skills given to the patient.  'Living Well with Diabetes' patient education workbook and carb counting booklet given to patient and his wife.  Information given for [employer] ReliOn glucose meter.  Patient's wife made him an appointment with his primary care doctor for tomorrow.  Strongly encouraged lifestyle modification through diet and exercise.  MD said she will be starting him on Metformin.  Discussed possible GI upset with this medication when starting for the first time.  Patient is overwhelmed but accepting.  States that it runs in his family and his sister also has it.  Contact information left with the patient and his wife if questions were left unanswered.    Thank you  Piedad Climes [facility] Inpatient Diabetes Coordinator [phone]

## 2012-03-12 NOTE — ED Notes (Signed)
Pt reports a headache, blurred vision, and increased urination x 3-4 days.

## 2012-03-12 NOTE — Discharge Instructions (Signed)
Hyperglycemia Hyperglycemia occurs when the glucose (sugar) in your blood is too high. Hyperglycemia can happen for many reasons, but it most often happens to people who do not know they have diabetes or are not managing their diabetes properly.  CAUSES  Whether you have diabetes or not, there are other causes of hyperglycemia. Hyperglycemia can occur when you have diabetes, but it can also occur in other situations that you might not be as aware of, such as: Diabetes  If you have diabetes and are having problems controlling your blood glucose, hyperglycemia could occur because of some of the following reasons:   Not following your meal plan.   Not taking your diabetes medications or not taking it properly.   Exercising less or doing less activity than you normally do.   Being sick.  Pre-diabetes  This cannot be ignored. Before people develop Type 2 diabetes, they almost always have "pre-diabetes." This is when your blood glucose levels are higher than normal, but not yet high enough to be diagnosed as diabetes. Research has shown that some long-term damage to the body, especially the heart and circulatory system, may already be occurring during pre-diabetes. If you take action to manage your blood glucose when you have pre-diabetes, you may delay or prevent Type 2 diabetes from developing.  Stress  If you have diabetes, you may be "diet" controlled or on oral medications or insulin to control your diabetes. However, you may find that your blood glucose is higher than usual in the hospital whether you have diabetes or not. This is often referred to as "stress hyperglycemia." Stress can elevate your blood glucose. This happens because of hormones put out by the body during times of stress. If stress has been the cause of your high blood glucose, it can be followed regularly by your caregiver. That way he/she can make sure your hyperglycemia does not continue to get worse or progress to diabetes.    Steroids  Steroids are medications that act on the infection fighting system (immune system) to block inflammation or infection. One side effect can be a rise in blood glucose. Most people can produce enough extra insulin to allow for this rise, but for those who cannot, steroids make blood glucose levels go even higher. It is not unusual for steroid treatments to "uncover" diabetes that is developing. It is not always possible to determine if the hyperglycemia will go away after the steroids are stopped. A special blood test called an A1c is sometimes done to determine if your blood glucose was elevated before the steroids were started.  SYMPTOMS  Thirsty.   Frequent urination.   Dry mouth.   Blurred vision.   Tired or fatigue.   Weakness.   Sleepy.   Tingling in feet or leg.  DIAGNOSIS  Diagnosis is made by monitoring blood glucose in one or all of the following ways:  A1c test. This is a chemical found in your blood.   Fingerstick blood glucose monitoring.   Laboratory results.  TREATMENT  First, knowing the cause of the hyperglycemia is important before the hyperglycemia can be treated. Treatment may include, but is not be limited to:  Education.   Change or adjustment in medications.   Change or adjustment in meal plan.   Treatment for an illness, infection, etc.   More frequent blood glucose monitoring.   Change in exercise plan.   Decreasing or stopping steroids.   Lifestyle changes.  HOME CARE INSTRUCTIONS   Test your blood glucose  as directed.   Exercise regularly. Your caregiver will give you instructions about exercise. Pre-diabetes or diabetes which comes on with stress is helped by exercising.   Eat wholesome, balanced meals. Eat often and at regular, fixed times. Your caregiver or nutritionist will give you a meal plan to guide your sugar intake.   Being at an ideal weight is important. If needed, losing as little as 10 to 15 pounds may help  improve blood glucose levels.  SEEK MEDICAL CARE IF:   You have questions about medicine, activity, or diet.   You continue to have symptoms (problems such as increased thirst, urination, or weight gain).  SEEK IMMEDIATE MEDICAL CARE IF:   You are vomiting or have diarrhea.   Your breath smells fruity.   You are breathing faster or slower.   You are very sleepy or incoherent.   You have numbness, tingling, or pain in your feet or hands.   You have chest pain.   Your symptoms get worse even though you have been following your caregiver's orders.   If you have any other questions or concerns.  Document Released: 05/15/2001 Document Revised: 11/08/2011 Document Reviewed: 07/11/2009 [facility] Patient Information 2012 [place], [place].Diabetes, Type 2 Diabetes is a long-lasting (chronic) disease. In type 2 diabetes, the pancreas does not make enough insulin (a hormone), and the body does not respond normally to the insulin that is made. This type of diabetes was also previously called adult-onset diabetes. It usually occurs after the age of 38, but it can occur at any age.  CAUSES  Type 2 diabetes happens because the pancreasis not making enough insulin or your body has trouble using the insulin that your pancreas does make properly. SYMPTOMS   Drinking more than usual.   Urinating more than usual.   Blurred vision.   Dry, itchy skin.   Frequent infections.   Feeling more tired than usual (fatigue).  DIAGNOSIS The diagnosis of type 2 diabetes is usually made by one of the following tests:  Fasting blood glucose test. You will not eat for at least 8 hours and then take a blood test.   Random blood glucose test. Your blood glucose (sugar) is checked at any time of the day regardless of when you ate.   Oral glucose tolerance test (OGTT). Your blood glucose is measured after you have not eaten (fasted) and then after you drink a glucose containing beverage.  TREATMENT    Healthy eating.   Exercise.   Medicine, if needed.   Monitoring blood glucose.   Seeing your caregiver regularly.  HOME CARE INSTRUCTIONS   Check your blood glucose at least once a day. More frequent monitoring may be necessary, depending on your medicines and on how well your diabetes is controlled. Your caregiver will advise you.   Take your medicine as directed by your caregiver.   Do not smoke.   Make wise food choices. Ask your caregiver for information. Weight loss can improve your diabetes.   Learn about low blood glucose (hypoglycemia) and how to treat it.   Get your eyes checked regularly.   Have a yearly physical exam. Have your blood pressure checked and your blood and urine tested.   Wear a pendant or bracelet saying that you have diabetes.   Check your feet every night for cuts, sores, blisters, and redness. Let your caregiver know if you have any problems.  SEEK MEDICAL CARE IF:   You have problems keeping your blood glucose in target range.  You have problems with your medicines.   You have symptoms of an illness that do not improve after 24 hours.   You have a sore or wound that is not healing.   You notice a change in vision or a new problem with your vision.   You have a fever.  MAKE SURE YOU:  Understand these instructions.   Will watch your condition.   Will get help right away if you are not doing well or get worse.  Document Released: 11/19/2005 Document Revised: 11/08/2011 Document Reviewed: 05/07/2011 [facility] Patient Information 2012 [place].

## 2012-08-30 ENCOUNTER — Encounter (HOSPITAL_BASED_OUTPATIENT_CLINIC_OR_DEPARTMENT_OTHER): Payer: Self-pay | Admitting: Emergency Medicine

## 2012-08-30 ENCOUNTER — Emergency Department (HOSPITAL_BASED_OUTPATIENT_CLINIC_OR_DEPARTMENT_OTHER)
Admission: EM | Admit: 2012-08-30 | Discharge: 2012-08-30 | Disposition: A | Discharge status: Home / Self Care | Payer: Self-pay | Attending: Emergency Medicine | Admitting: Emergency Medicine

## 2012-08-30 DIAGNOSIS — I10 Essential (primary) hypertension: Secondary | ICD-10-CM | POA: Insufficient documentation

## 2012-08-30 DIAGNOSIS — K047 Periapical abscess without sinus: Secondary | ICD-10-CM | POA: Insufficient documentation

## 2012-08-30 DIAGNOSIS — E119 Type 2 diabetes mellitus without complications: Secondary | ICD-10-CM | POA: Insufficient documentation

## 2012-08-30 MED ORDER — HYDROCODONE-ACETAMINOPHEN 5-500 MG PO TABS: 1.0000 | ORAL_TABLET | Freq: Four times a day (QID) | ORAL | Status: DC | PRN

## 2012-08-30 MED ORDER — PENICILLIN V POTASSIUM 250 MG PO TABS: 250.0000 mg | ORAL_TABLET | Freq: Four times a day (QID) | ORAL | Status: AC

## 2012-08-30 NOTE — ED Notes (Signed)
Pt has swollen area at upper left tooth.  Worse since yesterday.

## 2012-08-30 NOTE — ED Provider Notes (Addendum)
History     CSN: [id number]  Arrival date & time 08/30/12  4098   First MD Initiated Contact with Patient 08/30/12 1016      Chief Complaint  Patient presents with  . Oral Swelling    (Consider location/radiation/quality/duration/timing/severity/associated sxs/prior treatment) Patient is a 36 y.o. male presenting with tooth pain. The history is provided by the patient.  Dental PainThe primary symptoms include mouth pain. Primary symptoms do not include fever, sore throat or cough. Episode onset: 3 days ago. The symptoms are worsening. The symptoms are new. The symptoms occur constantly.  Additional symptoms include: gum swelling and gum tenderness. Additional symptoms do not include: jaw pain, facial swelling, trouble swallowing, pain with swallowing, drooling and ear pain. Medical issues include: periodontal disease. Medical issues do not include: smoking.    Past Medical History  Diagnosis Date  . Hypertension   . Migraines   . Migraine   . Diabetes mellitus     Past Surgical History  Procedure Date  . Shoulder surgery   . Skin biopsy     History reviewed. No pertinent family history.  History  Substance Use Topics  . Smoking status: Never Smoker   . Smokeless tobacco: Not on file  . Alcohol Use: No      Review of Systems  Constitutional: Negative for fever.  HENT: Negative for ear pain, sore throat, facial swelling, drooling and trouble swallowing.   Respiratory: Negative for cough.   All other systems reviewed and are negative.    Allergies  Review of patient's allergies indicates no known allergies.  Home Medications   Current Outpatient Rx  Name Route Sig Dispense Refill  . CARISOPRODOL 350 MG PO TABS Oral Take 350 mg by mouth 4 (four) times daily as needed. For muscle spasms    . HYDROCHLOROTHIAZIDE 25 MG PO TABS Oral Take 25 mg by mouth daily.      . IBUPROFEN 200 MG PO TABS Oral Take 200 mg by mouth every 6 (six) hours as needed. Patient used  this medication for his headache.    . METFORMIN HCL 1000 MG PO TABS Oral Take 1 tablet (1,000 mg total) by mouth 2 (two) times daily. 60 tablet 0  . METOPROLOL TARTRATE 100 MG PO TABS Oral Take 100 mg by mouth 2 (two) times daily.      . TRIPLE ANTIBIOTIC [phone] EX OINT Topical Apply 1 application topically 4 (four) times daily. Patient used this medication for a cut on his finger.    . TOPIRAMATE 100 MG PO TABS Oral Take 100 mg by mouth daily.        BP 165/104  Pulse 99  Temp 98.5 F (36.9 C) (Oral)  Resp 16  SpO2 99%  Physical Exam  Nursing note and vitals reviewed. Constitutional: He is oriented to person, place, and time. He appears well-developed and well-nourished. No distress.  HENT:  Head: Normocephalic and atraumatic.  Mouth/Throat:    Eyes: EOM are normal. Pupils are equal, round, and reactive to light.  Neck: Normal range of motion. Neck supple.  Pulmonary/Chest: Effort normal.  Lymphadenopathy:    He has no cervical adenopathy.  Neurological: He is alert and oriented to person, place, and time.  Skin: Skin is warm and dry. No rash noted. No erythema.  Psychiatric: He has a normal mood and affect. His behavior is normal.    ED Course  Procedures (including critical care time)  Labs Reviewed - No data to display No results found.  INCISION AND DRAINAGE Performed by: Gwyneth Sprout Consent: Verbal consent obtained. Risks and benefits: risks, benefits and alternatives were discussed Type: abscess  Body area: roof of mouth  Anesthesia: local infiltration  Local anesthetic: Bupivacaine 0.5% Anesthetic total: 0.5 ml  Complexity: Simple  Drainage: purulent  Drainage amount: 1 mL  Packing material: None Patient tolerance: Patient tolerated the procedure well with no immediate complications.    1. Dental abscess       MDM   Pt with dental caries and oral abscess in the roof of the mouth.  No signs of ludwig's angina or difficulty  swallowing and no systemic symptoms.  Abscess drained. Will treat with PCN and have pt f/u with dentist.         Gwyneth Sprout, MD 08/30/12 1028  Gwyneth Sprout, MD 08/30/12 1036

## 2012-09-14 ENCOUNTER — Emergency Department (HOSPITAL_BASED_OUTPATIENT_CLINIC_OR_DEPARTMENT_OTHER): Payer: BC Managed Care – PPO

## 2012-09-14 ENCOUNTER — Emergency Department (HOSPITAL_BASED_OUTPATIENT_CLINIC_OR_DEPARTMENT_OTHER)
Admission: EM | Admit: 2012-09-14 | Discharge: 2012-09-14 | Disposition: A | Discharge status: Home / Self Care | Payer: BC Managed Care – PPO | Attending: Emergency Medicine | Admitting: Emergency Medicine

## 2012-09-14 ENCOUNTER — Encounter (HOSPITAL_BASED_OUTPATIENT_CLINIC_OR_DEPARTMENT_OTHER): Payer: Self-pay | Admitting: *Deleted

## 2012-09-14 DIAGNOSIS — R079 Chest pain, unspecified: Secondary | ICD-10-CM | POA: Insufficient documentation

## 2012-09-14 DIAGNOSIS — I10 Essential (primary) hypertension: Secondary | ICD-10-CM | POA: Insufficient documentation

## 2012-09-14 DIAGNOSIS — Z79899 Other long term (current) drug therapy: Secondary | ICD-10-CM | POA: Insufficient documentation

## 2012-09-14 LAB — CBC
HCT: 42 % (ref 39.0–52.0)
Hemoglobin: 14.2 g/dL (ref 13.0–17.0)
MCH: 27.7 pg (ref 26.0–34.0)
MCHC: 33.8 g/dL (ref 30.0–36.0)
MCV: 82 fL (ref 78.0–100.0)
Platelets: 297 10*3/uL (ref 150–400)
RBC: 5.12 MIL/uL (ref 4.22–5.81)
RDW: 13.7 % (ref 11.5–15.5)
WBC: 8.3 10*3/uL (ref 4.0–10.5)

## 2012-09-14 LAB — COMPREHENSIVE METABOLIC PANEL
ALT: 19 U/L (ref 0–53)
AST: 19 U/L (ref 0–37)
Albumin: 4.2 g/dL (ref 3.5–5.2)
Alkaline Phosphatase: 65 U/L (ref 39–117)
BUN: 12 mg/dL (ref 6–23)
CO2: 24 mEq/L (ref 19–32)
Calcium: 9.3 mg/dL (ref 8.4–10.5)
Chloride: 102 mEq/L (ref 96–112)
Creatinine, Ser: 0.9 mg/dL (ref 0.50–1.35)
GFR calc Af Amer: >90 mL/min (ref >90)
GFR calc non Af Amer: >90 mL/min (ref >90)
Glucose, Bld: 105 mg/dL — ABNORMAL HIGH (ref 70–99)
Potassium: 3.8 mEq/L (ref 3.5–5.1)
Sodium: 139 mEq/L (ref 135–145)
Total Bilirubin: 0.6 mg/dL (ref 0.3–1.2)
Total Protein: 7.9 g/dL (ref 6.0–8.3)

## 2012-09-14 LAB — APTT: aPTT: 43 seconds — ABNORMAL HIGH (ref 24–37)

## 2012-09-14 LAB — PROTIME-INR
INR: 0.96 (ref 0.00–1.49)
Prothrombin Time: 12.7 seconds (ref 11.6–15.2)

## 2012-09-14 LAB — TROPONIN I
Troponin I: <0.3 ng/mL (ref <0.30)
Troponin I: <0.3 ng/mL (ref <0.30)

## 2012-09-14 MED ORDER — SODIUM CHLORIDE 0.9 % IV SOLN: 1000.0000 mL | INTRAVENOUS | Status: DC

## 2012-09-14 MED ORDER — NITROGLYCERIN 0.4 MG SL SUBL
0.4000 mg | SUBLINGUAL_TABLET | SUBLINGUAL | Status: DC | PRN
  Filled 2012-09-14: qty 25

## 2012-09-14 MED ORDER — ASPIRIN 81 MG PO CHEW
324.0000 mg | CHEWABLE_TABLET | Freq: Once | ORAL | Status: AC
  Administered 2012-09-14: 324 mg via ORAL
  Filled 2012-09-14: qty 4

## 2012-09-14 NOTE — ED Provider Notes (Signed)
Results for orders placed during the hospital encounter of 09/14/12  CBC      Component Value Range   WBC 8.3  4.0 - 10.5 K/uL   RBC 5.12  4.22 - 5.81 MIL/uL   Hemoglobin 14.2  13.0 - 17.0 g/dL   HCT 16.1  09.6 - 04.5 %   MCV 82.0  78.0 - 100.0 fL   MCH 27.7  26.0 - 34.0 pg   MCHC 33.8  30.0 - 36.0 g/dL   RDW 40.9  81.1 - 91.4 %   Platelets 297  150 - 400 K/uL  COMPREHENSIVE METABOLIC PANEL      Component Value Range   Sodium 139  135 - 145 mEq/L   Potassium 3.8  3.5 - 5.1 mEq/L   Chloride 102  96 - 112 mEq/L   CO2 24  19 - 32 mEq/L   Glucose, Bld 105 (*) 70 - 99 mg/dL   BUN 12  6 - 23 mg/dL   Creatinine, Ser 7.82  0.50 - 1.35 mg/dL   Calcium 9.3  8.4 - 95.6 mg/dL   Total Protein 7.9  6.0 - 8.3 g/dL   Albumin 4.2  3.5 - 5.2 g/dL   AST 19  0 - 37 U/L   ALT 19  0 - 53 U/L   Alkaline Phosphatase 65  39 - 117 U/L   Total Bilirubin 0.6  0.3 - 1.2 mg/dL   GFR calc non Af Amer >90  >90 mL/min   GFR calc Af Amer >90  >90 mL/min  TROPONIN I      Component Value Range   Troponin I <0.30  <0.30 ng/mL  PROTIME-INR      Component Value Range   Prothrombin Time 12.7  11.6 - 15.2 seconds   INR 0.96  0.00 - 1.49  APTT      Component Value Range   aPTT 43 (*) 24 - 37 seconds  TROPONIN I      Component Value Range   Troponin I <0.30  <0.30 ng/mL   Dg Chest 2 View  09/14/2012  *RADIOLOGY REPORT*  Clinical Data: Chest pain  CHEST - 2 VIEW  Comparison: 05/02/2009  Findings: Normal heart size.  Clear lungs.  No pleural effusion. No pneumothorax.  IMPRESSION: No active cardiopulmonary disease.   Original Report Authenticated By: Donavan Burnet, M.D.     Pt's 2nd troponin negative.  Discussed with pt, he is to f/u with his PMD tomorrow to arrange outpt stress.  Advised to return immediately if his symptoms worsen  Rolan Bucco, MD 09/14/12 Rickey Primus

## 2012-09-14 NOTE — ED Provider Notes (Signed)
History     CSN: [id number]  Arrival date & time 09/14/12  1259   First MD Initiated Contact with Patient 09/14/12 1304      Chief Complaint  Patient presents with  . Chest Pain    Patient is a 36 y.o. male presenting with chest pain. The history is provided by the patient.  Chest Pain Episode onset: yesterday. Duration of episode(s) is 1 day. Chest pain occurs constantly (waxing and waning). The severity of the pain is mild. The quality of the pain is described as tightness. The pain does not radiate (although shoulder blade on left is a little tight). Exacerbated by: nothing that he notes;  he was working out last night but felt fine during and immediately after. Pertinent negatives for primary symptoms include no fever, no shortness of breath, no cough, no nausea and no vomiting. He tried nothing for the symptoms.  Pertinent negatives for past medical history include no DVT, no MI and no PE.  His family medical history is significant for CAD in family (grandmother).  Pertinent negatives for family medical history include: no early MI in family and no PE in family.  Procedure history is negative for cardiac catheterization, stress echo and exercise treadmill test.   Pt exercises regularly, 5 times per week without trouble.  Occsnl indegestion, but nothing like that today.  EPisodes last one hour or so.  SOmetimes a little worse sitting up.  Past Medical History  Diagnosis Date  . Hypertension   . Migraines   . Migraine   . Diabetes mellitus     Past Surgical History  Procedure Date  . Shoulder surgery   . Skin biopsy     History reviewed. No pertinent family history.  History  Substance Use Topics  . Smoking status: Never Smoker   . Smokeless tobacco: Not on file  . Alcohol Use: No      Review of Systems  Constitutional: Negative for fever.  Respiratory: Negative for cough and shortness of breath.   Cardiovascular: Positive for chest pain.  Gastrointestinal:  Negative for nausea and vomiting.  All other systems reviewed and are negative.    Allergies  Review of patient's allergies indicates no known allergies.  Home Medications   Current Outpatient Rx  Name Route Sig Dispense Refill  . CARISOPRODOL 350 MG PO TABS Oral Take 350 mg by mouth 4 (four) times daily as needed. For muscle spasms    . HYDROCHLOROTHIAZIDE 25 MG PO TABS Oral Take 25 mg by mouth daily.      Marland Kitchen HYDROCODONE-ACETAMINOPHEN 5-500 MG PO TABS Oral Take 1-2 tablets by mouth every 6 (six) hours as needed for pain. 15 tablet 0  . IBUPROFEN 200 MG PO TABS Oral Take 200 mg by mouth every 6 (six) hours as needed. Patient used this medication for his headache.    . METFORMIN HCL 1000 MG PO TABS Oral Take 1 tablet (1,000 mg total) by mouth 2 (two) times daily. 60 tablet 0  . METOPROLOL TARTRATE 100 MG PO TABS Oral Take 100 mg by mouth 2 (two) times daily.      . TRIPLE ANTIBIOTIC [phone] EX OINT Topical Apply 1 application topically 4 (four) times daily. Patient used this medication for a cut on his finger.    . TOPIRAMATE 100 MG PO TABS Oral Take 100 mg by mouth daily.        BP 157/94  Pulse 80  Temp 98.7 F (37.1 C) (Oral)  Resp 20  Ht 6\' 1"  (1.854 m)  Wt 270 lb (122.471 kg)  BMI 35.62 kg/m2  SpO2 98%  Physical Exam  Nursing note and vitals reviewed. Constitutional: He appears well-developed and well-nourished. No distress.  HENT:  Head: Normocephalic and atraumatic.  Right Ear: External ear normal.  Left Ear: External ear normal.  Eyes: Conjunctivae normal are normal. Right eye exhibits no discharge. Left eye exhibits no discharge. No scleral icterus.  Neck: Neck supple. No tracheal deviation present.  Cardiovascular: Normal rate, regular rhythm and intact distal pulses.   Pulmonary/Chest: Effort normal and breath sounds normal. No stridor. No respiratory distress. He has no wheezes. He has no rales.  Abdominal: Soft. Bowel sounds are normal. He exhibits no  distension. There is no tenderness. There is no rebound and no guarding.  Musculoskeletal: He exhibits no edema and no tenderness.  Neurological: He is alert. He has normal strength. No sensory deficit. Cranial nerve deficit:  no gross defecits noted. He exhibits normal muscle tone. He displays no seizure activity. Coordination normal.  Skin: Skin is warm and dry. No rash noted.  Psychiatric: He has a normal mood and affect.    ED Course  Procedures (including critical care time)  Rate: 78  Rhythm: normal sinus rhythm  QRS Axis: normal  Intervals: normal  ST/T Wave abnormalities: normal  Conduction Disutrbances:none  Narrative Interpretation:   Old EKG Reviewed: none available  Labs Reviewed  COMPREHENSIVE METABOLIC PANEL - Abnormal; Notable for the following:    Glucose, Bld 105 (*)     All other components within normal limits  APTT - Abnormal; Notable for the following:    aPTT 43 (*)     All other components within normal limits  CBC  TROPONIN I  PROTIME-INR  URINALYSIS, ROUTINE W REFLEX MICROSCOPIC  TROPONIN I  TROPONIN I   Dg Chest 2 View  09/14/2012  *RADIOLOGY REPORT*  Clinical Data: Chest pain  CHEST - 2 VIEW  Comparison: 05/02/2009  Findings: Normal heart size.  Clear lungs.  No pleural effusion. No pneumothorax.  IMPRESSION: No active cardiopulmonary disease.   Original Report Authenticated By: Donavan Burnet, M.D.      MDM  The patient has cardiac risk factors including hypertension and diabetes. His chest pain is not exertional but he does describe it as a pressure in his chest. Overall, my suspicion is low however I do feel that he would benefit from stress testing. Patient does have a primary doctor and is able to arrange close followup. I have discussed the findings with him.  we'll plan on 2 sets of cardiac markers.  If negative the patient be discharged home to followup with his doctor tomorrow for stress testing        Celene Kras, MD 09/14/12 1534

## 2012-09-14 NOTE — ED Notes (Addendum)
Pt presents to ED today with left sided chest pain that started last night.  Pt reports no precip factors.  Pt took no OTC meds PTA.  Pt states was working out at gym last night

## 2013-02-23 ENCOUNTER — Encounter (HOSPITAL_BASED_OUTPATIENT_CLINIC_OR_DEPARTMENT_OTHER): Payer: Self-pay | Admitting: *Deleted

## 2013-02-23 ENCOUNTER — Emergency Department (HOSPITAL_BASED_OUTPATIENT_CLINIC_OR_DEPARTMENT_OTHER)
Admission: EM | Admit: 2013-02-23 | Discharge: 2013-02-23 | Disposition: A | Discharge status: Home / Self Care | Payer: 59 | Attending: Emergency Medicine | Admitting: Emergency Medicine

## 2013-02-23 DIAGNOSIS — Y93G3 Activity, cooking and baking: Secondary | ICD-10-CM | POA: Insufficient documentation

## 2013-02-23 DIAGNOSIS — Y92009 Unspecified place in unspecified non-institutional (private) residence as the place of occurrence of the external cause: Secondary | ICD-10-CM | POA: Insufficient documentation

## 2013-02-23 DIAGNOSIS — E119 Type 2 diabetes mellitus without complications: Secondary | ICD-10-CM | POA: Insufficient documentation

## 2013-02-23 DIAGNOSIS — T23109A Burn of first degree of unspecified hand, unspecified site, initial encounter: Secondary | ICD-10-CM | POA: Insufficient documentation

## 2013-02-23 DIAGNOSIS — T23129A Burn of first degree of unspecified single finger (nail) except thumb, initial encounter: Secondary | ICD-10-CM | POA: Insufficient documentation

## 2013-02-23 DIAGNOSIS — T300 Burn of unspecified body region, unspecified degree: Secondary | ICD-10-CM

## 2013-02-23 DIAGNOSIS — T310 Burns involving less than 10% of body surface: Secondary | ICD-10-CM | POA: Insufficient documentation

## 2013-02-23 DIAGNOSIS — I10 Essential (primary) hypertension: Secondary | ICD-10-CM | POA: Insufficient documentation

## 2013-02-23 DIAGNOSIS — X020XXA Exposure to flames in controlled fire in building or structure, initial encounter: Secondary | ICD-10-CM | POA: Insufficient documentation

## 2013-02-23 DIAGNOSIS — Z79899 Other long term (current) drug therapy: Secondary | ICD-10-CM | POA: Insufficient documentation

## 2013-02-23 DIAGNOSIS — G43909 Migraine, unspecified, not intractable, without status migrainosus: Secondary | ICD-10-CM | POA: Insufficient documentation

## 2013-02-23 MED ORDER — SILVER SULFADIAZINE 1 % EX CREA: TOPICAL_CREAM | Freq: Every day | CUTANEOUS | Status: DC
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
## 2013-02-23 NOTE — ED Notes (Signed)
MD at bedside. 
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
## 2013-07-31 DIAGNOSIS — I10 Essential (primary) hypertension: Secondary | ICD-10-CM | POA: Insufficient documentation

## 2013-07-31 DIAGNOSIS — Z79899 Other long term (current) drug therapy: Secondary | ICD-10-CM | POA: Insufficient documentation

## 2013-07-31 DIAGNOSIS — G43909 Migraine, unspecified, not intractable, without status migrainosus: Secondary | ICD-10-CM | POA: Insufficient documentation

## 2013-07-31 DIAGNOSIS — W2209XA Striking against other stationary object, initial encounter: Secondary | ICD-10-CM | POA: Insufficient documentation

## 2013-07-31 DIAGNOSIS — S61209A Unspecified open wound of unspecified finger without damage to nail, initial encounter: Secondary | ICD-10-CM | POA: Insufficient documentation

## 2013-07-31 DIAGNOSIS — Y9289 Other specified places as the place of occurrence of the external cause: Secondary | ICD-10-CM | POA: Insufficient documentation

## 2013-07-31 DIAGNOSIS — E119 Type 2 diabetes mellitus without complications: Secondary | ICD-10-CM | POA: Insufficient documentation

## 2013-07-31 DIAGNOSIS — Y9389 Activity, other specified: Secondary | ICD-10-CM | POA: Insufficient documentation

## 2013-08-01 ENCOUNTER — Encounter (HOSPITAL_BASED_OUTPATIENT_CLINIC_OR_DEPARTMENT_OTHER): Payer: Self-pay | Admitting: *Deleted

## 2013-08-01 ENCOUNTER — Emergency Department (HOSPITAL_BASED_OUTPATIENT_CLINIC_OR_DEPARTMENT_OTHER): Payer: 59

## 2013-08-01 ENCOUNTER — Emergency Department (HOSPITAL_BASED_OUTPATIENT_CLINIC_OR_DEPARTMENT_OTHER)
Admission: EM | Admit: 2013-08-01 | Discharge: 2013-08-01 | Disposition: A | Discharge status: Home / Self Care | Payer: 59 | Attending: Emergency Medicine | Admitting: Emergency Medicine

## 2013-08-01 DIAGNOSIS — S91209A Unspecified open wound of unspecified toe(s) with damage to nail, initial encounter: Secondary | ICD-10-CM

## 2013-08-01 NOTE — ED Provider Notes (Signed)
CSN: [id number]     Arrival date & time 07/31/13  2355 History   First MD Initiated Contact with Patient 08/01/13 0011     Chief Complaint  Patient presents with  . Toe Injury   (Consider location/radiation/quality/duration/timing/severity/associated sxs/prior Treatment) The history is provided by the patient.   37 year old male accidentally hit his left third toe against a corner of a wall and bent the nail back. He is having some bleeding from underneath nail. Last tetanus immunization was within the past year. He rates his pain at 2/10.  Past Medical History  Diagnosis Date  . Hypertension   . Migraines   . Migraine   . Diabetes mellitus    Past Surgical History  Procedure Laterality Date  . Shoulder surgery    . Skin biopsy     No family history on file. History  Substance Use Topics  . Smoking status: Never Smoker   . Smokeless tobacco: Not on file  . Alcohol Use: No    Review of Systems  All other systems reviewed and are negative.    Allergies  Review of patient's allergies indicates no known allergies.  Home Medications   Current Outpatient Rx  Name  Route  Sig  Dispense  Refill  . carisoprodol (SOMA) 350 MG tablet   Oral   Take 350 mg by mouth 4 (four) times daily as needed. For muscle spasms         . hydrochlorothiazide (HYDRODIURIL) 25 MG tablet   Oral   Take 25 mg by mouth daily.           Marland Kitchen HYDROcodone-acetaminophen (VICODIN) 5-500 MG per tablet   Oral   Take 1-2 tablets by mouth every 6 (six) hours as needed for pain.   15 tablet   0   . ibuprofen (ADVIL,MOTRIN) 200 MG tablet   Oral   Take 200 mg by mouth every 6 (six) hours as needed. Patient used this medication for his headache.         . losartan (COZAAR) 25 MG tablet   Oral   Take 25 mg by mouth daily.         Marland Kitchen EXPIRED: metFORMIN (GLUCOPHAGE) 1000 MG tablet   Oral   Take 1 tablet (1,000 mg total) by mouth 2 (two) times daily.   60 tablet   0   . metoprolol (LOPRESSOR)  100 MG tablet   Oral   Take 100 mg by mouth 2 (two) times daily.           Marland Kitchen neomycin-bacitracin-polymyxin (NEOSPORIN) [phone] ointment   Topical   Apply 1 application topically 4 (four) times daily. Patient used this medication for a cut on his finger.         . silver sulfADIAZINE (SILVADENE) 1 % cream   Topical   Apply topically daily.   50 g   0   . topiramate (TOPAMAX) 100 MG tablet   Oral   Take 100 mg by mouth daily.            BP 151/87  Temp(Src) 98.1 F (36.7 C)  Ht 6\' 1"  (1.854 m)  Wt 291 lb (131.997 kg)  BMI 38.4 kg/m2  SpO2 100% Physical Exam  Nursing note and vitals reviewed.  37 year old male, resting comfortably and in no acute distress. Vital signs are significant for hypertension with blood pressure 151/87. Oxygen saturation is 100%, which is normal. Head is normocephalic and atraumatic. PERRLA, EOMI. Oropharynx is clear. Neck is  nontender and supple without adenopathy or JVD. Back is nontender and there is no CVA tenderness. Lungs are clear without rales, wheezes, or rhonchi. Chest is nontender. Heart has regular rate and rhythm without murmur. Abdomen is soft, flat, nontender without masses or hepatosplenomegaly and peristalsis is normoactive. Extremities have no cyanosis or edema, full range of motion is present. Left third toenail is slightly loose with some bleeding coming from the distal margin of the nail. No subungual hematoma is present. Skin is warm and dry without rash. Neurologic: Mental status is normal, cranial nerves are intact, there are no motor or sensory deficits.  ED Course  Procedures (including critical care time)  Imaging Review Dg Toe 3rd Left  08/01/2013   *RADIOLOGY REPORT*  Clinical Data: Trauma with pain  LEFT THIRD TOE  Comparison: None.  Findings:  No acute fracture or subluxation.  No radiodense foreign body.  IMPRESSION: No acute osseous findings.   Original Report Authenticated By: Tiburcio Pea    Images  viewed by me.  MDM   1. Nail avulsion, toe, initial encounter    Partial avulsion of the left third toenail. He'll be sent for x-rays. Treatment options have been explained to the patient. He has chosen to treat it conservatively with taping the nail in place. He is aware that he is slightly loose the nail with a new nail coming in within the next 3-6 months.    Dione Booze, MD 08/01/13 [phone]

## 2013-08-01 NOTE — ED Notes (Addendum)
Simply dressing of sterile 2x2 and coban applied to 3rd toe digit pt currently states pain at 3-4/10 scale  And tolerated application of dressing well and w/o difficulty.

## 2013-08-01 NOTE — ED Notes (Addendum)
States he hit his left middle toe on the corner of the wall. States nail is loose and has been bleeding. States toe is "throbbing" no deformity noted to toe. Nail is loose on exam

## 2013-09-08 ENCOUNTER — Ambulatory Visit (INDEPENDENT_AMBULATORY_CARE_PROVIDER_SITE_OTHER): Payer: 59 | Admitting: Neurology

## 2013-09-08 ENCOUNTER — Encounter: Payer: Self-pay | Admitting: Neurology

## 2013-09-08 VITALS — BP 151/97 | HR 74 | Temp 98.1°F | Ht 73.0 in | Wt 289.0 lb

## 2013-09-08 DIAGNOSIS — I10 Essential (primary) hypertension: Secondary | ICD-10-CM

## 2013-09-08 DIAGNOSIS — G4733 Obstructive sleep apnea (adult) (pediatric): Secondary | ICD-10-CM | POA: Insufficient documentation

## 2013-09-08 DIAGNOSIS — E669 Obesity, unspecified: Secondary | ICD-10-CM

## 2013-09-08 DIAGNOSIS — G43109 Migraine with aura, not intractable, without status migrainosus: Secondary | ICD-10-CM | POA: Insufficient documentation

## 2013-09-08 HISTORY — DX: Migraine with aura, not intractable, without status migrainosus: G43.109

## 2013-09-08 HISTORY — DX: Obstructive sleep apnea (adult) (pediatric): G47.33

## 2013-09-08 MED ORDER — TOPIRAMATE ER 100 MG PO CAP24: 300.0000 mg | ORAL_CAPSULE | Freq: Every day | ORAL | Status: DC

## 2013-09-08 NOTE — Progress Notes (Signed)
Subjective:    Patient ID: Johnathan Steele is a 37 y.o. male.  HPI  Huston Foley, MD, PhD [facility] [address] [address] [place], [place] [address]  Dear Dr. Riley Nearing,   I saw your patient, Johnathan Steele, upon your kind request in my neurologic clinic today for initial consultation of his migraine headaches. The patient is unaccompanied today. As you know, Johnathan Steele is a very pleasant 37 year old right-handed gentleman with an underlying medical history of allergic rhinitis, hypertension, type 2 diabetes, edema, fatigue, fatty liver, hyperlipidemia, insomnia, muscle spasms, OSA on CPAP since 2013, obesity and recurrent headaches who has previously been diagnosed with migraine headaches and has been seeing a neurologist in [facility] since 2009. He has been tried on Topamax (helped, but caused mental slowness) and tramodol, and Imitrex, which caused BP surges. He was tried on Zonegran, which did not help and a week ago, he switched back to Topamax, now at 100 mg in AM, 200 mg qHS.  He has not tried: VPA, elavil, verapamil, inderal.  He uses CPAP, but not every night, using it 4-5 nights a week. He forgets to use it sometimes.   He feels that he has had worsening headaches in the past 2-3 months. He is currently on Flexeril, tramadol, metformin, simvastatin, metoprolol, losartan and topiramate.  He describes an aura of lightheadedness and blurry vision.   He reports a bilateral headache, which is described as intermittent and as a throbbing sensation. There is associated nausea and no vomiting, and there is photophobia. His headache (HA) frequency is 3 per week. He estimates that there are 15+ HA days/month in the last 3 months. There is family history of migraine in his sister, who died in her 30s. She had depression, DM, OSA as well.  The patient denies prior TIA or stroke symptoms, such as sudden onset of one sided weakness, numbness, tingling,  slurring of speech or droopy face, hearing loss, tinnitus, diplopia or visual field cut or monocular loss of vision.  He is no intermittent medical leave.  He had a brain MRI in   His Past Medical History Is Significant For: Past Medical History  Diagnosis Date  . Hypertension   . Migraines   . Migraine   . Diabetes mellitus     His Past Surgical History Is Significant For: Past Surgical History  Procedure Laterality Date  . Shoulder surgery    . Skin biopsy      His Family History Is Significant For: No family history on file.  His Social History Is Significant For: History   Social History  . Marital Status: Married    Spouse Name: N/A    Number of Children: N/A  . Years of Education: N/A   Social History Main Topics  . Smoking status: Never Smoker   . Smokeless tobacco: None  . Alcohol Use: No  . Drug Use: No  . Sexual Activity:    Other Topics Concern  . None   Social History Narrative  . None    His Allergies Are:  Allergies  Allergen Reactions  . Topamax [Topiramate] Other (See Comments)    Cognitive skills deteriorate.  Reflex's slow down markedly.  :   His Current Medications Are:  Outpatient Encounter Prescriptions as of 09/08/2013  Medication Sig Dispense Refill  . cyclobenzaprine (FLEXERIL) 10 MG tablet Take 10 mg by mouth 3 (three) times daily as needed for muscle spasms.      Marland Kitchen ibuprofen (  ADVIL,MOTRIN) 200 MG tablet Take 200 mg by mouth every 6 (six) hours as needed. Patient used this medication for his headache.      . losartan (COZAAR) 25 MG tablet Take 25 mg by mouth daily.      . metFORMIN (GLUCOPHAGE) 1000 MG tablet Take 1 tablet (1,000 mg total) by mouth 2 (two) times daily.  60 tablet  0  . metoprolol (LOPRESSOR) 100 MG tablet Take 100 mg by mouth 2 (two) times daily.        . metoprolol succinate (TOPROL-XL) 100 MG 24 hr tablet Take 1 tablet by mouth 2 (two) times daily.      Marland Kitchen topiramate (TOPAMAX) 100 MG tablet Take 100 mg by mouth  daily.        . carisoprodol (SOMA) 350 MG tablet Take 350 mg by mouth 4 (four) times daily as needed. For muscle spasms      . hydrochlorothiazide (HYDRODIURIL) 25 MG tablet Take 25 mg by mouth daily.        . traMADol (ULTRAM) 50 MG tablet Take 1 tablet by mouth as needed.      . [DISCONTINUED] HYDROcodone-acetaminophen (VICODIN) 5-500 MG per tablet Take 1-2 tablets by mouth every 6 (six) hours as needed for pain.  15 tablet  0  . [DISCONTINUED] neomycin-bacitracin-polymyxin (NEOSPORIN) [phone] ointment Apply 1 application topically 4 (four) times daily. Patient used this medication for a cut on his finger.      . [DISCONTINUED] silver sulfADIAZINE (SILVADENE) 1 % cream Apply topically daily.  50 g  0   No facility-administered encounter medications on file as of 09/08/2013.   Review of Systems:  Out of a complete 14 point review of systems, all are reviewed and negative with the exception of these symptoms as listed below: Review of Systems  Constitutional: Positive for fatigue.  Eyes: Positive for pain and visual disturbance (blurred).  Neurological: Positive for dizziness and headaches.    Objective:  Neurologic Exam  Physical Exam Physical Examination:   Filed Vitals:   09/08/13 1009  BP: 151/97  Pulse: 74  Temp: 98.1 F (36.7 C)    General Examination: The patient is a very pleasant 37 y.o. male in no acute distress. He appears well-developed and well-nourished and adequately groomed. He is obese.   HEENT: Normocephalic, atraumatic, pupils are equal, round and reactive to light and accommodation. Funduscopic exam is normal with sharp disc margins noted. Extraocular tracking is good without limitation to gaze excursion or nystagmus noted. Normal smooth pursuit is noted. Hearing is grossly intact. Tympanic membranes are clear bilaterally. Face is symmetric with normal facial animation and normal facial sensation. Speech is clear with no dysarthria noted. There is no  hypophonia. There is no lip, neck/head, jaw or voice tremor. Neck is supple with full range of passive and active motion. There are no carotid bruits on auscultation. Oropharynx exam reveals: mild mouth dryness, adequate dental hygiene and marked airway crowding, due to large tongue. Mallampati is class III. Tongue protrudes centrally and palate elevates symmetrically. Neck size is 18 inches.   Chest: Clear to auscultation without wheezing, rhonchi or crackles noted.  Heart: S1+S2+0, regular and normal without murmurs, rubs or gallops noted.   Abdomen: Soft, non-tender and non-distended with normal bowel sounds appreciated on auscultation.  Extremities: There is no pitting edema in the distal lower extremities bilaterally. Pedal pulses are intact.  Skin: Warm and dry without trophic changes noted. There are no varicose veins.  Musculoskeletal: exam reveals no obvious  joint deformities, tenderness or joint swelling or erythema.   Neurologically:  Mental status: The patient is awake, alert and oriented in all 4 spheres. His memory, attention, language and knowledge are appropriate. There is no aphasia, agnosia, apraxia or anomia. Speech is clear with normal prosody and enunciation. Thought process is linear. Mood is congruent and affect is normal.  Cranial nerves are as described above under HEENT exam. In addition, shoulder shrug is normal with equal shoulder height noted. Motor exam: Normal bulk, strength and tone is noted. There is no drift, tremor or rebound. Romberg is negative. Reflexes are 2+ throughout. Toes are downgoing bilaterally. Fine motor skills are intact with normal finger taps, normal hand movements, normal rapid alternating patting, normal foot taps and normal foot agility.  Cerebellar testing shows no dysmetria or intention tremor on finger to nose testing. Heel to shin is unremarkable bilaterally. There is no truncal or gait ataxia.  Sensory exam is intact to light touch,  pinprick, vibration, temperature sense in the upper and lower extremities.  Gait, station and balance are unremarkable. No veering to one side is noted. No leaning to one side is noted. Posture is age-appropriate and stance is narrow based. No problems turning are noted. He turns en bloc. Tandem walk is unremarkable. Intact toe and heel stance is noted.               Assessment and Plan:   In summary, Johnathan Steele is a very pleasant 37 y.o.-year old male with an underlying medical history of DM, HTN, HLP, obesity and OSA, whose history and exam are in keeping with a diagnosis of migraine with aura of several year duration. His physical exam is stable and non-focal with the exception of obesity and tight airway. He has had some improvement with topiramate, but has cognitive SEs.  I had a long chat with the patient about my findings and the diagnosis of migraine, the prognosis and treatment options. We talked about medical treatments and non-pharmacological approaches. We talked about maintaining a healthy lifestyle in general. I encouraged the patient to eat healthy, exercise daily and keep well hydrated, to keep a scheduled bedtime and wake time routine, to not skip any meals and eat healthy snacks in between meals and to have protein with every meal.   I asked him to continue using CPAP regularly each night. Untreated obstructive sleep apnea when it is moderate to severe can have an adverse impact on cardiovascular health and raise her risk for heart disease, arrhythmias, hypertension, congestive heart failure, stroke and diabetes. Untreated obstructive sleep apnea causes sleep disruption, nonrestorative sleep, and sleep deprivation. This can have an impact on your day to day functioning and cause daytime sleepiness and impairment of cognitive function, memory loss, and recurrent HAs, mood disturbance, and problems focussing. Using CPAP regularly can improve these symptoms.   I advised the patient  about common headache triggers: sleep deprivation, dehydration, overheating, stress, hypoglycemia or skipping meals and blood sugar fluctuations, excessive pain medications or excessive alcohol use or caffeine withdrawal. Some people have food triggers such as aged cheese, orange juice or chocolate, especially dark chocolate, or MSG (monosodium glutamate). He is to try to avoid these headache triggers as much possible. It may be helpful to keep a headache diary to figure out what makes His headaches worse or brings them on and what alleviates them. Some people report headache onset after exercise but studies have shown that regular exercise may actually prevent headaches from coming. If  He has exercise-induced headaches, He is advised to drink plenty of fluid before and after exercising and that to not overdo it and to not overheat.  As far as further diagnostic testing is concerned, I suggested the following today: no new test.   As far as medications are concerned, I recommended the following at this time: change to Trokendi XR 300 mg q HS to see if his cognitive SEs improve. Down the road, we can consider adding verapamil down the road. He is already on a beta blocker.  I answered all his questions today and the patient was in agreement with the above outlined plan. I would like to see the patient back in 3 months, sooner if the need arises and encouraged him to call with any interim questions, concerns, problems or updates and refill requests.   Thank you very much for allowing me to participate in the care of this nice patient. If I can be of any further assistance to you please do not hesitate to call me at [phone].  Sincerely,   Huston Foley, MD, PhD

## 2013-09-08 NOTE — Patient Instructions (Addendum)
Please remember, common headache triggers are: sleep deprivation, dehydration, overheating, stress, hypoglycemia or skipping meals and blood sugar fluctuations, excessive pain medications or excessive alcohol use or caffeine withdrawal. Some people have food triggers such as aged cheese, orange juice or chocolate, especially dark chocolate, or MSG (monosodium glutamate). Try to avoid these headache triggers as much possible. It may be helpful to keep a headache diary to figure out what makes your headaches worse or brings them on and what alleviates them. Some people report headache onset after exercise but studies have shown that regular exercise may actually prevent headaches from coming. If you have exercise-induced headaches, please make sure that you drink plenty of fluid before and after exercising and that you do not over do it and do not overheat.  Please continue using your CPAP regularly. While your insurance requires that you use CPAP at least 4 hours each night on 70% of the nights, I recommend, that you not skip any nights and use it throughout the night if you can. Getting used to CPAP does take time and patience and discipline. Untreated obstructive sleep apnea when it is moderate to severe can have an adverse impact on cardiovascular health and raise her risk for heart disease, arrhythmias, hypertension, congestive heart failure, stroke and diabetes. Untreated obstructive sleep apnea causes sleep disruption, nonrestorative sleep, and sleep deprivation. This can have an impact on your day to day functioning and cause daytime sleepiness and impairment of cognitive function, memory loss, mood disturbance, and problems focussing. Using CPAP regularly can improve these symptoms.  Talk to [employer] about your CPAP download, which will allow you to understand, how well your OSA is treated on the current settings.   We are switching you to Trokendi XR 300 mg each night instead of topamax.

## 2013-09-16 ENCOUNTER — Other Ambulatory Visit: Payer: Self-pay

## 2013-09-16 DIAGNOSIS — G43109 Migraine with aura, not intractable, without status migrainosus: Secondary | ICD-10-CM

## 2013-09-16 MED ORDER — TOPIRAMATE ER 100 MG PO CAP24: 300.0000 mg | ORAL_CAPSULE | Freq: Every day | ORAL | Status: DC

## 2013-09-16 NOTE — Telephone Encounter (Signed)
The pharmacy called asking that we resend the Trokendi Rx.  It was originally sent for a quantity of 12,090 in error.

## 2013-09-17 ENCOUNTER — Telehealth: Payer: Self-pay | Admitting: Neurology

## 2013-09-17 NOTE — Telephone Encounter (Signed)
If he is willing, we can reduce the dose of Trokendi XR to 200 mg each night. I do want him to take it before he goes to sleep. I do not suggest that he take it and to start driving. Please ask him and I can prescribe 200 mg strength

## 2013-09-17 NOTE — Telephone Encounter (Signed)
Patient stated that since taking the topiramate-100mg  samples that were given on Monday, although it works, it's leaving him mentally slow, afraid to drive after taking medication, would like to try something else.

## 2013-09-18 NOTE — Telephone Encounter (Signed)
It looks like Rx has been written. Please ask him to report back next week, regarding side effects, thx s

## 2013-09-18 NOTE — Telephone Encounter (Signed)
Spoke to patient. He says he is willing to try the Trokendi 200mg .

## 2013-09-18 NOTE — Telephone Encounter (Signed)
Called patient.  No answer.

## 2013-09-30 ENCOUNTER — Encounter (HOSPITAL_BASED_OUTPATIENT_CLINIC_OR_DEPARTMENT_OTHER): Payer: Self-pay | Admitting: Emergency Medicine

## 2013-09-30 ENCOUNTER — Emergency Department (HOSPITAL_BASED_OUTPATIENT_CLINIC_OR_DEPARTMENT_OTHER)
Admission: EM | Admit: 2013-09-30 | Discharge: 2013-09-30 | Disposition: A | Discharge status: Home / Self Care | Payer: 59 | Attending: Emergency Medicine | Admitting: Emergency Medicine

## 2013-09-30 DIAGNOSIS — R509 Fever, unspecified: Secondary | ICD-10-CM | POA: Insufficient documentation

## 2013-09-30 DIAGNOSIS — J029 Acute pharyngitis, unspecified: Secondary | ICD-10-CM | POA: Insufficient documentation

## 2013-09-30 DIAGNOSIS — Z8669 Personal history of other diseases of the nervous system and sense organs: Secondary | ICD-10-CM | POA: Insufficient documentation

## 2013-09-30 DIAGNOSIS — I10 Essential (primary) hypertension: Secondary | ICD-10-CM | POA: Insufficient documentation

## 2013-09-30 DIAGNOSIS — IMO0001 Reserved for inherently not codable concepts without codable children: Secondary | ICD-10-CM | POA: Insufficient documentation

## 2013-09-30 DIAGNOSIS — G43109 Migraine with aura, not intractable, without status migrainosus: Secondary | ICD-10-CM | POA: Insufficient documentation

## 2013-09-30 DIAGNOSIS — E119 Type 2 diabetes mellitus without complications: Secondary | ICD-10-CM | POA: Insufficient documentation

## 2013-09-30 DIAGNOSIS — Z79899 Other long term (current) drug therapy: Secondary | ICD-10-CM | POA: Insufficient documentation

## 2013-09-30 LAB — RAPID STREP SCREEN (MED CTR MEBANE ONLY): Streptococcus, Group A Screen (Direct): NEGATIVE

## 2013-09-30 MED ORDER — PENICILLIN G BENZATHINE 1200000 UNIT/2ML IM SUSP
1.2000 10*6.[IU] | Freq: Once | INTRAMUSCULAR | Status: AC
  Administered 2013-09-30: 1.2 10*6.[IU] via INTRAMUSCULAR
  Filled 2013-09-30: qty 2

## 2013-09-30 NOTE — ED Provider Notes (Signed)
Medical screening examination/treatment/procedure(s) were performed by non-physician practitioner and as supervising physician I was immediately available for consultation/collaboration.  EKG Interpretation   None         Rolan Bucco, MD 09/30/13 1929

## 2013-09-30 NOTE — ED Notes (Signed)
Pt c/o sore throat x2 days 

## 2013-09-30 NOTE — ED Provider Notes (Signed)
CSN: [id number]     Arrival date & time 09/30/13  1721 History   First MD Initiated Contact with Patient 09/30/13 1736     Chief Complaint  Patient presents with  . Sore Throat   (Consider location/radiation/quality/duration/timing/severity/associated sxs/prior Treatment) Patient is a 37 y.o. male presenting with pharyngitis. The history is provided by the patient. No language interpreter was used.  Sore Throat This is a new problem. The current episode started yesterday. The problem occurs constantly. The problem has been unchanged. Associated symptoms include a fever, headaches, myalgias and a sore throat. Pertinent negatives include no coughing or rash. The symptoms are aggravated by swallowing. He has tried nothing for the symptoms.    Past Medical History  Diagnosis Date  . Hypertension   . Migraines   . Migraine   . Diabetes mellitus   . Migraine with aura 09/08/2013  . OSA (obstructive sleep apnea) 09/08/2013   Past Surgical History  Procedure Laterality Date  . Shoulder surgery    . Skin biopsy     History reviewed. No pertinent family history. History  Substance Use Topics  . Smoking status: Never Smoker   . Smokeless tobacco: Not on file  . Alcohol Use: No    Review of Systems  Constitutional: Positive for fever.  HENT: Positive for sore throat.   Respiratory: Negative for cough.   Cardiovascular: Negative.   Musculoskeletal: Positive for myalgias.  Skin: Negative for rash.  Neurological: Positive for headaches.    Allergies  Topamax  Home Medications   Current Outpatient Rx  Name  Route  Sig  Dispense  Refill  . carisoprodol (SOMA) 350 MG tablet   Oral   Take 350 mg by mouth 4 (four) times daily as needed. For muscle spasms         . cyclobenzaprine (FLEXERIL) 10 MG tablet   Oral   Take 10 mg by mouth 3 (three) times daily as needed for muscle spasms.         . hydrochlorothiazide (HYDRODIURIL) 25 MG tablet   Oral   Take 25 mg by mouth  daily.           Marland Kitchen ibuprofen (ADVIL,MOTRIN) 200 MG tablet   Oral   Take 200 mg by mouth every 6 (six) hours as needed. Patient used this medication for his headache.         . losartan (COZAAR) 25 MG tablet   Oral   Take 25 mg by mouth daily.         Marland Kitchen EXPIRED: metFORMIN (GLUCOPHAGE) 1000 MG tablet   Oral   Take 1 tablet (1,000 mg total) by mouth 2 (two) times daily.   60 tablet   0   . metoprolol (LOPRESSOR) 100 MG tablet   Oral   Take 100 mg by mouth 2 (two) times daily.           . metoprolol succinate (TOPROL-XL) 100 MG 24 hr tablet   Oral   Take 1 tablet by mouth 2 (two) times daily.         Marland Kitchen topiramate (TOPAMAX) 100 MG tablet   Oral   Take 100 mg by mouth daily.           . Topiramate ER (TROKENDI XR) 100 MG CP24   Oral   Take 300 mg by mouth at bedtime.   90 capsule   3   . traMADol (ULTRAM) 50 MG tablet   Oral   Take 1 tablet by mouth  as needed.          BP 149/91  Pulse 102  Temp(Src) 99.8 F (37.7 C) (Oral)  Resp 18  Ht 6' (1.829 m)  Wt 280 lb (127.007 kg)  BMI 37.97 kg/m2  SpO2 98% Physical Exam  Nursing note and vitals reviewed. Constitutional: He is oriented to person, place, and time. He appears well-developed and well-nourished.  HENT:  Right Ear: External ear normal.  Left Ear: External ear normal.  Mouth/Throat: Oropharyngeal exudate and posterior oropharyngeal erythema present. No tonsillar abscesses.  Cardiovascular: Normal rate and regular rhythm.   Pulmonary/Chest: Effort normal and breath sounds normal.  Musculoskeletal: Normal range of motion.  Neurological: He is alert and oriented to person, place, and time.  Skin: No rash noted.    ED Course  Procedures (including critical care time) Labs Review Labs Reviewed  RAPID STREP SCREEN  CULTURE, GROUP A STREP   Imaging Review No results found.  EKG Interpretation   None       MDM   1. Pharyngitis    Pt treated with bicillin based on history and  exam    Teressa Lower, NP 09/30/13 1806

## 2013-10-02 LAB — CULTURE, GROUP A STREP

## 2013-10-08 ENCOUNTER — Other Ambulatory Visit: Payer: Self-pay

## 2013-10-28 ENCOUNTER — Telehealth: Payer: Self-pay | Admitting: *Deleted

## 2013-10-28 DIAGNOSIS — G43909 Migraine, unspecified, not intractable, without status migrainosus: Secondary | ICD-10-CM

## 2013-10-28 MED ORDER — VERAPAMIL HCL 40 MG PO TABS: 40.0000 mg | ORAL_TABLET | Freq: Three times a day (TID) | ORAL | Status: DC

## 2013-10-28 NOTE — Telephone Encounter (Signed)
Please advise 

## 2013-10-28 NOTE — Telephone Encounter (Signed)
As discussed I would like for him to start taking Verapamil 40 mg: take 1 pill in AM for one week, then twice daily for one week, then 3 times a day thereafter. Side effects may include: constipation, lightheadedness, fainting, heartburn, wheezing, slow heartbeat, blurry vision, blue lips and fingernails, pins and needles sensations.  Rx done and sent to pharmacy. If you have a rash or difficulty breathing or chest pain, you have to stop the medication immediately. Call 911 if you think you are having a serious reaction.  He can continue with the other medications for now.

## 2013-10-28 NOTE — Telephone Encounter (Signed)
I called patient and left VM reiterating Dr. Johny Sax' instructions for taking medication and to let him know Rx was sent to [facility] pharmacy on [address]

## 2014-01-05 ENCOUNTER — Ambulatory Visit: Payer: 59 | Admitting: Neurology

## 2014-03-21 ENCOUNTER — Emergency Department (HOSPITAL_BASED_OUTPATIENT_CLINIC_OR_DEPARTMENT_OTHER)
Admission: EM | Admit: 2014-03-21 | Discharge: 2014-03-21 | Disposition: A | Discharge status: Home / Self Care | Payer: 59 | Attending: Emergency Medicine | Admitting: Emergency Medicine

## 2014-03-21 DIAGNOSIS — Z79899 Other long term (current) drug therapy: Secondary | ICD-10-CM | POA: Insufficient documentation

## 2014-03-21 DIAGNOSIS — W261XXA Contact with sword or dagger, initial encounter: Secondary | ICD-10-CM

## 2014-03-21 DIAGNOSIS — Y939 Activity, unspecified: Secondary | ICD-10-CM | POA: Insufficient documentation

## 2014-03-21 DIAGNOSIS — E119 Type 2 diabetes mellitus without complications: Secondary | ICD-10-CM | POA: Insufficient documentation

## 2014-03-21 DIAGNOSIS — Z8669 Personal history of other diseases of the nervous system and sense organs: Secondary | ICD-10-CM | POA: Insufficient documentation

## 2014-03-21 DIAGNOSIS — S61219A Laceration without foreign body of unspecified finger without damage to nail, initial encounter: Secondary | ICD-10-CM

## 2014-03-21 DIAGNOSIS — S61209A Unspecified open wound of unspecified finger without damage to nail, initial encounter: Secondary | ICD-10-CM | POA: Insufficient documentation

## 2014-03-21 DIAGNOSIS — W260XXA Contact with knife, initial encounter: Secondary | ICD-10-CM | POA: Insufficient documentation

## 2014-03-21 DIAGNOSIS — I10 Essential (primary) hypertension: Secondary | ICD-10-CM | POA: Insufficient documentation

## 2014-03-21 DIAGNOSIS — G43109 Migraine with aura, not intractable, without status migrainosus: Secondary | ICD-10-CM | POA: Insufficient documentation

## 2014-03-21 DIAGNOSIS — Y929 Unspecified place or not applicable: Secondary | ICD-10-CM | POA: Insufficient documentation

## 2014-03-21 NOTE — ED Provider Notes (Signed)
CSN: [id number]     Arrival date & time 03/21/14  1702 History  This chart was scribed for Hilario Quarryanielle S Ray, MD by Dorothey Basemania Sutton, ED Scribe. This patient was seen in room MH01/MH01 and the patient's care was started at 5:41 PM.    Chief Complaint  Patient presents with  . Extremity Laceration   Patient is a 38 y.o. male presenting with skin laceration. The history is provided by the patient. No language interpreter was used.  Laceration Location:  Finger Finger laceration location:  L index finger Length (cm):  1 Depth:  Through dermis Bleeding: controlled   Laceration mechanism:  Knife Pain details:    Severity:  Moderate   Timing:  Constant   Progression:  Unchanged Foreign body present:  No foreign bodies Relieved by:  Pressure Tetanus status:  Up to date  HPI Comments: Johnathan Steele is a 38 y.o. male who presents to the Emergency Department complaining of a laceration to the left, 1st finger that he sustained a few hours ago on a clean kitchen knife. Patient applied pressure and a gauze bandage to the area PTA and the bleeding is well-controlled at this time. He reports an associated, constant, mild pain to the area. Patient reports that his last tetanus vaccination was about 1.5 years ago and is UTD. Patient has a history of HTN and DM.   Past Medical History  Diagnosis Date  . Hypertension   . Migraines   . Migraine   . Diabetes mellitus   . Migraine with aura 09/08/2013  . OSA (obstructive sleep apnea) 09/08/2013   Past Surgical History  Procedure Laterality Date  . Shoulder surgery    . Skin biopsy     No family history on file. History  Substance Use Topics  . Smoking status: Never Smoker   . Smokeless tobacco: Not on file  . Alcohol Use: No    Review of Systems  Musculoskeletal: Positive for myalgias.  Skin: Positive for wound.  All other systems reviewed and are negative.     Allergies  Topamax  Home Medications   Prior to Admission medications    Medication Sig Start Date End Date Taking? Authorizing Provider  cyclobenzaprine (FLEXERIL) 10 MG tablet Take 10 mg by mouth 3 (three) times daily as needed for muscle spasms.    Historical Provider, MD  ibuprofen (ADVIL,MOTRIN) 200 MG tablet Take 200 mg by mouth every 6 (six) hours as needed. Patient used this medication for his headache.    Historical Provider, MD  losartan (COZAAR) 25 MG tablet Take 25 mg by mouth daily.    Historical Provider, MD  metFORMIN (GLUCOPHAGE) 1000 MG tablet Take 1 tablet (1,000 mg total) by mouth 2 (two) times daily. 03/12/12 09/08/13  Nat ChristenKathleen A Hosmer, MD  metoprolol (LOPRESSOR) 100 MG tablet Take 100 mg by mouth 2 (two) times daily.      Historical Provider, MD  metoprolol succinate (TOPROL-XL) 100 MG 24 hr tablet Take 1 tablet by mouth 2 (two) times daily. 06/14/13   Historical Provider, MD   Triage Vitals: BP 127/86  Pulse 75  Temp(Src) 98.2 F (36.8 C) (Oral)  Resp 16  Ht 6\' 1"  (1.854 m)  Wt 300 lb (136.079 kg)  BMI 39.59 kg/m2  SpO2 97%  Physical Exam  Nursing note and vitals reviewed. Constitutional: He is oriented to person, place, and time. He appears well-developed and well-nourished. No distress.  HENT:  Head: Normocephalic and atraumatic.  Eyes: Conjunctivae are normal.  Neck:  Normal range of motion. Neck supple.  Pulmonary/Chest: Effort normal. No respiratory distress.  Abdominal: He exhibits no distension.  Musculoskeletal: Normal range of motion.  Neurological: He is alert and oriented to person, place, and time.  Skin: Skin is warm and dry.  Left radial surface of the 2nd digit at the PIP joint has about a 1 cm laceration through the epidermis. Minimal bleeding.   Psychiatric: He has a normal mood and affect. His behavior is normal.    ED Course  Procedures (including critical care time)  DIAGNOSTIC STUDIES: Oxygen Saturation is 97% on room air, normal by my interpretation.    COORDINATION OF CARE: 5:43 PM- Discussed that the  laceration will not need to be repaired. Return precautions given. Discussed treatment plan with patient at bedside and patient verbalized agreement.     Labs Review Labs Reviewed - No data to display  Imaging Review No results found.   EKG Interpretation None      MDM   Final diagnoses:  Finger laceration     I personally performed the services described in this documentation, which was scribed in my presence. The recorded information has been reviewed and considered.    Hilario Quarryanielle S Ray, MD 03/21/14 [phone]

## 2014-03-21 NOTE — ED Notes (Signed)
Agree with triage assessment, dressing to left hand, no bleeding from wound. Cut on kitchen knife

## 2014-03-21 NOTE — ED Notes (Signed)
Laceration to left 1st finger with carving knife.  Bleeding controlled.

## 2014-03-21 NOTE — Discharge Instructions (Signed)
Wound Care Wound care helps prevent pain and infection.  You may need a tetanus shot if:  You cannot remember when you had your last tetanus shot.  You have never had a tetanus shot.  The injury broke your skin. If you need a tetanus shot and you choose not to have one, you may get tetanus. Sickness from tetanus can be serious. HOME CARE   Only take medicine as told by your doctor.  Clean the wound daily with mild soap and water.  Change any bandages (dressings) as told by your doctor.  Put medicated cream and a bandage on the wound as told by your doctor.  Change the bandage if it gets wet, dirty, or starts to smell.  Take showers. Do not take baths, swim, or do anything that puts your wound under water.  Rest and raise (elevate) the wound until the pain and puffiness (swelling) are better.  Keep all doctor visits as told. GET HELP RIGHT AWAY IF:   Yellowish-white fluid (pus) comes from the wound.  Medicine does not lessen your pain.  There is a red streak going away from the wound.  You have a fever. MAKE SURE YOU:   Understand these instructions.  Will watch your condition.  Will get help right away if you are not doing well or get worse. Document Released: 08/28/2008 Document Revised: 02/11/2012 Document Reviewed: 03/25/2011 [employer] Patient Information 2014 [employer]  

## 2015-09-18 ENCOUNTER — Encounter (HOSPITAL_COMMUNITY): Payer: Self-pay | Admitting: Emergency Medicine

## 2015-09-18 ENCOUNTER — Emergency Department (HOSPITAL_COMMUNITY)
Admission: EM | Admit: 2015-09-18 | Discharge: 2015-09-18 | Disposition: A | Discharge status: Home / Self Care | Payer: BLUE CROSS/BLUE SHIELD | Attending: Emergency Medicine | Admitting: Emergency Medicine

## 2015-09-18 DIAGNOSIS — I10 Essential (primary) hypertension: Secondary | ICD-10-CM | POA: Diagnosis not present

## 2015-09-18 DIAGNOSIS — F329 Major depressive disorder, single episode, unspecified: Secondary | ICD-10-CM | POA: Insufficient documentation

## 2015-09-18 DIAGNOSIS — Z79899 Other long term (current) drug therapy: Secondary | ICD-10-CM | POA: Insufficient documentation

## 2015-09-18 DIAGNOSIS — E119 Type 2 diabetes mellitus without complications: Secondary | ICD-10-CM | POA: Diagnosis not present

## 2015-09-18 DIAGNOSIS — G43109 Migraine with aura, not intractable, without status migrainosus: Secondary | ICD-10-CM | POA: Insufficient documentation

## 2015-09-18 DIAGNOSIS — F32A Depression, unspecified: Secondary | ICD-10-CM

## 2015-09-18 HISTORY — DX: Depression, unspecified: F32.A

## 2015-09-18 HISTORY — DX: Major depressive disorder, single episode, unspecified: F32.9

## 2015-09-18 NOTE — ED Notes (Addendum)
C/o increased depression over the past 5 days.  Denies SI/HI.  States he just wants to stay in bed and it takes a lot more energy to get up and do anything.  Denies AH/VH.  States he has been taking his depression medications.

## 2015-09-18 NOTE — Discharge Instructions (Signed)
Please follow-up with your psychiatrist tomorrow for reevaluation. Return to ED for worsening symptoms, especially if you feel like hurting your self or other people.  Major Depressive Disorder Major depressive disorder is a mental illness. It also may be called clinical depression or unipolar depression. Major depressive disorder usually causes feelings of sadness, hopelessness, or helplessness. Some people with this disorder do not feel particularly sad but lose interest in doing things they used to enjoy (anhedonia). Major depressive disorder also can cause physical symptoms. It can interfere with work, school, relationships, and other normal everyday activities. The disorder varies in severity but is longer lasting and more serious than the sadness we all feel from time to time in our lives. Major depressive disorder often is triggered by stressful life events or major life changes. Examples of these triggers include divorce, loss of your job or home, a move, and the death of a family member or close friend. Sometimes this disorder occurs for no obvious reason at all. People who have family members with major depressive disorder or bipolar disorder are at higher risk for developing this disorder, with or without life stressors. Major depressive disorder can occur at any age. It may occur just once in your life (single episode major depressive disorder). It may occur multiple times (recurrent major depressive disorder). SYMPTOMS People with major depressive disorder have either anhedonia or depressed mood on nearly a daily basis for at least 2 weeks or longer. Symptoms of depressed mood include:  Feelings of sadness (blue or down in the dumps) or emptiness.  Feelings of hopelessness or helplessness.  Tearfulness or episodes of crying (may be observed by others).  Irritability (children and adolescents). In addition to depressed mood or anhedonia or both, people with this disorder have at least four  of the following symptoms:  Difficulty sleeping or sleeping too much.   Significant change (increase or decrease) in appetite or weight.   Lack of energy or motivation.  Feelings of guilt and worthlessness.   Difficulty concentrating, remembering, or making decisions.  Unusually slow movement (psychomotor retardation) or restlessness (as observed by others).   Recurrent wishes for death, recurrent thoughts of self-harm (suicide), or a suicide attempt. People with major depressive disorder commonly have persistent negative thoughts about themselves, other people, and the world. People with severe major depressive disorder may experiencedistorted beliefs or perceptions about the world (psychotic delusions). They also may see or hear things that are not real (psychotic hallucinations). DIAGNOSIS Major depressive disorder is diagnosed through an assessment by your health care provider. Your health care provider will ask aboutaspects of your daily life, such as mood,sleep, and appetite, to see if you have the diagnostic symptoms of major depressive disorder. Your health care provider may ask about your medical history and use of alcohol or drugs, including prescription medicines. Your health care provider also may do a physical exam and blood work. This is because certain medical conditions and the use of certain substances can cause major depressive disorder-like symptoms (secondary depression). Your health care provider also may refer you to a mental health specialist for further evaluation and treatment. TREATMENT It is important to recognize the symptoms of major depressive disorder and seek treatment. The following treatments can be prescribed for this disorder:   Medicine. Antidepressant medicines usually are prescribed. Antidepressant medicines are thought to correct chemical imbalances in the brain that are commonly associated with major depressive disorder. Other types of medicine  may be added if the symptoms do not respond  to antidepressant medicines alone or if psychotic delusions or hallucinations occur.  Talk therapy. Talk therapy can be helpful in treating major depressive disorder by providing support, education, and guidance. Certain types of talk therapy also can help with negative thinking (cognitive behavioral therapy) and with relationship issues that trigger this disorder (interpersonal therapy). A mental health specialist can help determine which treatment is best for you. Most people with major depressive disorder do well with a combination of medicine and talk therapy. Treatments involving electrical stimulation of the brain can be used in situations with extremely severe symptoms or when medicine and talk therapy do not work over time. These treatments include electroconvulsive therapy, transcranial magnetic stimulation, and vagal nerve stimulation.   This information is not intended to replace advice given to you by your health care provider. Make sure you discuss any questions you have with your health care provider.   Document Released: 03/16/2013 Document Revised: 12/10/2014 Document Reviewed: 03/16/2013 Elsevier Interactive Patient Education [employer]   Emergency Department Resource Guide 1) Find a Doctor and Pay Out of Pocket Although you won't have to find out who is covered by your insurance plan, it is a good idea to ask around and get recommendations. You will then need to call the office and see if the doctor you have chosen will accept you as a new patient and what types of options they offer for patients who are self-pay. Some doctors offer discounts or will set up payment plans for their patients who do not have insurance, but you will need to ask so you aren't surprised when you get to your appointment.  2) Contact Your Local Health Department Not all health departments have doctors that can see patients for sick visits, but many do, so  it is worth a call to see if yours does. If you don't know where your local health department is, you can check in your phone book. The CDC also has a tool to help you locate your state's health department, and many state websites also have listings of all of their local health departments.  3) Find a Walk-in Clinic If your illness is not likely to be very severe or complicated, you may want to try a walk in clinic. These are popping up all over the country in pharmacies, drugstores, and shopping centers. They're usually staffed by nurse practitioners or physician assistants that have been trained to treat common illnesses and complaints. They're usually fairly quick and inexpensive. However, if you have serious medical issues or chronic medical problems, these are probably not your best option.  No Primary Care Doctor: - Call Health Connect at  [phone] - they can help you locate a primary care doctor that  accepts your insurance, provides certain services, etc. - Physician Referral Service- [phone]  Chronic Pain Problems: Organization         Address  Phone   Notes  [facility]  [phone] Patients need to be referred by their primary care doctor.   Medication Assistance: Organization         Address  Phone   Notes  [facility] Medication [facility] [address] [place] [place], [place] [address] [phone] --Must be a resident of [facility] -- Must have NO insurance coverage whatsoever (no Medicaid/ Medicare, etc.) -- The pt. MUST have a primary care doctor that directs their care regularly and follows them in the community   [facility]ist  [phone]  [employer]  [phone]    Agencies that provide inexpensive medical care: Organization         Address  Phone   Notes  [facility]  [phone]   [facility]    [phone]   [facility] [address] [place], [place] [address] [phone]   [facility] [address] [place], [place] [phone]   Planned Parenthood    [phone]   [facility]    [phone]   Community Health and [facility]  [address], [place] Phone:  [phone], Fax:  [phone] Hours of Operation:  9 am - 6 pm, M-F.  Also accepts Medicaid/Medicare and self-pay.  [facility]  [address], [place] Phone: [phone], Fax: [phone]. Hours of Operation:  8:30 am - 5:30 pm, M-F.  Also accepts Medicaid and self-pay.  [facility] [place] [address], [place] Phone: [phone]   [facility] [address] Natasha Bence [place], [place] [phone], Ext. [phone] Mondays & Thursdays: 7-9 AM.  First 15 patients are seen on a first come, first serve basis.    Medicaid-accepting [facility] Providers:  Organization         Address  Phone   Notes  [facility] [address], [place] [phone] Also accepts self-pay patients.  [facility] [address] Laurell Josephs [place], [place]  [phone]   [facility] [address], [place] [phone]   [facility] [address] [place] [phone]   Renaye Rakers [address], [place]   [phone] Only accepts [place] patients after they have their name applied to their card.   Self-Pay (no insurance) in [facility]:  Organization         Address  Phone   Notes  Sickle Cell Patients, [facility] [address] [place], [place] [phone]   [facility] [address] [place], [place] [phone]   [facility] [place]  [address], [place] [phone]   [facility]/Dr. Osei-Bonsu  [address], [place] or  [address], [place] [phone] Phone number for both [place] and [place] locations is the same.  Urgent Medical and [facility] [place] [address], [place] [phone]   [facility] [address], [place] or [address] [phone]   [facility] [address], [place] [phone], phone; [phone], fax Sees patients 1st and 3rd Saturday of every month.  Must not qualify for public or private insurance (i.e. Medicaid, Medicare, [place] Health Choice, Veterans' Benefits)  Household income should be no more than 200% of the poverty level The clinic cannot treat you if you are pregnant or think you are pregnant  Sexually transmitted diseases are not treated at the clinic.    Dental Care: Organization         Address  Phone  Notes  [facility] Department of [facility] [address] [place], [place] [phone] Accepts children up to age 74 who are enrolled in [place] or [place] Health Choice; pregnant women with a Medicaid card;  and children who have applied for Medicaid or [place] Health Choice, but were declined, whose parents can pay a reduced fee at time of service.  [facility] Department of [facility]  [address], [place] [phone] Accepts children up to age 42 who are enrolled in [place] or [place] Health Choice; pregnant women with a Medicaid card; and children who have applied for Medicaid or  Health Choice, but were declined, whose parents can pay a reduced fee at time of service.  [facility]  [address] [place], [place] [phone] Patients are seen by appointment only. Walk-ins are not accepted. [facility] will see patients 73 years of age and older. Monday - Tuesday (8am-5pm) Most Wednesdays (8:30-5pm) $30 per visit, cash only  [facility] Adult Dental Access PRO[facility]  [address], [facility] [phone] Patients are seen by appointment only. Walk-ins are not accepted. [facility] will see patients 57 years of age and older. One Wednesday Evening (Monthly: Volunteer Based).  $30 per visit, cash only  [employer]  [phone] for adults; Children under age 53, call Graduate Pediatric Dentistry at [phone]. Children aged 30-14, please call [phone] to request a pediatric application.  Dental services are provided in all areas of dental care including fillings, crowns and bridges, complete and partial dentures, implants, gum treatment, root canals, and extractions. Preventive care is also provided. Treatment is provided to both adults and children. Patients are selected via a lottery and there is often a waiting list.   [facility] [address] [place]  [phone] [email or web address]   [facility] [address] [place], [place] [phone], Ext. [phone] Second and Fourth Thursday of each month, opens at 6:30 AM; Clinic ends at 9 AM.  Patients are seen on a first-come first-served basis, and a limited number are seen during each clinic.   [facility]  [address] Ether Griffins [place], [place] [phone]   Eligibility Requirements You must have lived in [place], [place], or [place] counties for at least the last three months.   You cannot be eligible for state or federal sponsored [place], including [employer], [place], or [employer].   You generally cannot be eligible for healthcare insurance through your employer.    How to apply: Eligibility screenings are held every Tuesday and Wednesday afternoon from 1:00 pm until 4:00 pm. You do not need an appointment for the interview!  [facility] [place] [facility] [address], [place], [place] [phone]   [facility] Health Department  [phone]   [facility] Health Department  [phone]   [facility]  Health Department  [phone]    Behavioral Health Resources in the Community: Intensive Outpatient Programs Organization         Address  Phone  Notes  [facility] [address] [place], [place] [phone]   [facility] Outpatient [address] [place], [place] [phone]   [facility] [address], [place], [place]  [phone]   [facility] Mental Health [address]  [place], [place] [phone] or [phone]   Substance Abuse Resources Organization         Address  Phone  Notes  [facility] Drug Services  [phone]   [facility]  [phone]   [facility]  [phone]   [facility]  [phone]  Residential & Outpatient Substance Abuse Program  [phone]   Psychological Services Organization         Address  Phone  Notes  [facility]  [phone]- [phone]   [facility]  [phone]- [phone]   [facility] [phone], [place] [phone] or [phone]    Mobile Crisis Teams Organization         Address  Phone  Notes  [facility], Mobile Crisis Care Unit  [phone]   [facility]  [address] [place], [place] [phone]   Doristine LocksSharon DeEsch [address] [place] [phone]    Self-Help/Support Groups Organization         Address  Phone             Notes  [facility] [place] - variety of support groups  336- [phone] Call for more information  Narcotics Anonymous (NA), [facility] [address], [place]  2 meetings at this location   Statisticianesidential Treatment Programs Organization         Address  Phone  Notes  [facility] [address],    [place]  [phone]   [facility]  [address], [place] [phone], [place], [place] [phone]   [facility] [address], [place] [phone]  Admissions: 8am-3pm M-F  [facility] [address]    [place], [place] [phone]   [facility] [address], [place], [place] [phone]   [facility] [address]  [place], [place] [phone]   [facility] - Intensive Outpatient [address], [place], [place] [phone]   [facility] ([facility] [address]  [place], [place] [phone] or [phone]   [facility] (RT[facility]) [address] [place], [place] [phone] Accepts Medicaid  [facility] [address]  [place] [phone] Substance Abuse/Addiction Treatment   [facility] Behavioral Health Resources Organization         Address  Phone  Notes  [facility]  [phone]   Angie FavaJulie Brannon, PhD [address] [place], [place]   [phone](336) [phone] or [phone]) [phone]   [facility]   [address] [place], [place] [phone](336) [phone]   [facility] [address], [place], [place] [phone]) [phone] Insurance/Medicaid/sponsorship through [facility]  [facility] [address]                                    [place], [place] [phone]) [phone] Therapy/tele-psych/case  [facility] [address]   [place], [place] [phone](336) [phone]    Dr. Lolly MustacheArfeen  [phone]   [facility]  [employer] [facility] 1) [address], [place] 2) [address], [place] 3)  [address], [place] [phone]  [phone](336) [phone]   [facility] Child Abuse Hotline [phone](336) [phone] or [phone](336) [phone] (After Hours)

## 2015-09-18 NOTE — ED Provider Notes (Signed)
CSN: [id number]     Arrival date & time 09/18/15  1513 History   First MD Initiated Contact with Patient 09/18/15 1528     Chief Complaint  Patient presents with  . Depression     (Consider location/radiation/quality/duration/timing/severity/associated sxs/prior Treatment) HPI Johnathan Steele is a 39 y.o. male history of hypertension, diabetes, depression comes in for evaluation of worsening depression. Patient reports worsening depression since Thursday. Denies any specific inciting events. Reports increased difficulty doing daily chores. Reports "excessive amount of energy to do basic tasks". He relates all of this to ongoing depression. States that he does have a psychiatrist in [place] and will be able to contact him tomorrow for reevaluation. States he currently takes Flexeril for his depression. Reports she has been taking all his medications as prescribed. He denies any suicidal or homicidal ideations. No auditory or visual hallucinations. Denies any other illicit drug use, specifically benzodiazepines or alcohol use. No other aggravating or modifying factors.  Past Medical History  Diagnosis Date  . Hypertension   . Migraines   . Migraine   . Diabetes mellitus   . Migraine with aura 09/08/2013  . OSA (obstructive sleep apnea) 09/08/2013  . Depression    Past Surgical History  Procedure Laterality Date  . Shoulder surgery    . Skin biopsy     No family history on file. Social History  Substance Use Topics  . Smoking status: Never Smoker   . Smokeless tobacco: None  . Alcohol Use: No    Review of Systems A 10 point review of systems was completed and was negative except for pertinent positives and negatives as mentioned in the history of present illness     Allergies  Topamax  Home Medications   Prior to Admission medications   Medication Sig Start Date End Date Taking? Authorizing Provider  cyclobenzaprine (FLEXERIL) 10 MG tablet Take 10 mg by mouth 3  (three) times daily as needed for muscle spasms.    Historical Provider, MD  ibuprofen (ADVIL,MOTRIN) 200 MG tablet Take 200 mg by mouth every 6 (six) hours as needed. Patient used this medication for his headache.    Historical Provider, MD  losartan (COZAAR) 25 MG tablet Take 25 mg by mouth daily.    Historical Provider, MD  metFORMIN (GLUCOPHAGE) 1000 MG tablet Take 1 tablet (1,000 mg total) by mouth 2 (two) times daily. 03/12/12 09/08/13  Emeline General, MD  metoprolol (LOPRESSOR) 100 MG tablet Take 100 mg by mouth 2 (two) times daily.      Historical Provider, MD  metoprolol succinate (TOPROL-XL) 100 MG 24 hr tablet Take 1 tablet by mouth 2 (two) times daily. 06/14/13   Historical Provider, MD   BP 144/93 mmHg  Pulse 97  Temp(Src) 98.5 F (36.9 C) (Oral)  Resp 14  Ht  (1.854 m)  Wt 323 lb 5 oz (146.654 kg)  BMI 42.67 kg/m2  SpO2 99% Physical Exam  Constitutional: He is oriented to person, place, and time. He appears well-developed and well-nourished.  Large African-American male  HENT:  Head: Normocephalic and atraumatic.  Mouth/Throat: Oropharynx is clear and moist.  Eyes: Conjunctivae are normal. Pupils are equal, round, and reactive to light. Right eye exhibits no discharge. Left eye exhibits no discharge. No scleral icterus.  Neck: Neck supple.  Cardiovascular: Normal rate, regular rhythm and normal heart sounds.   Pulmonary/Chest: Effort normal and breath sounds normal. No respiratory distress. He has no wheezes. He has no rales.  Abdominal: Soft. There  is no tenderness.  Musculoskeletal: He exhibits no tenderness.  Neurological: He is alert and oriented to person, place, and time.  Cranial Nerves II-XII grossly intact  Skin: Skin is warm and dry. No rash noted.  Psychiatric: He has a normal mood and affect.  Patient does have appropriate affect and mood. Appropriate insight to situation.  Nursing note and vitals reviewed.   ED Course  Procedures (including critical  care time) Labs Review Labs Reviewed - No data to display  Imaging Review No results found. I have personally reviewed and evaluated these images and lab results as part of my medical decision-making.   EKG Interpretation None     Filed Vitals:   09/18/15 1523  BP: 144/93  Pulse: 97  Temp: 98.5 F (36.9 C)  TempSrc: Oral  Resp: 14  Height: 6\' 1"  (1.854 m)  Weight: 323 lb 5 oz (146.654 kg)  SpO2: 99%    MDM  Johnathan Steele is a 39 y.o. male with a history of depression comes in for evaluation of exacerbation of his depression. Patient denies any inciting events for his depression. No suicidal or homicidal ideations. No auditory or visual hallucinations. No substance abuse. No benzodiazepine or alcohol use. Will be able to follow up with his psychiatrist tomorrow. Discussed outpatient resources he can call. Also discussed specific return precautions including any suicidal or homicidal ideations or any intent for self-harm. On exam, patient is hemodynamically stable with normal vital signs. Physical exam is otherwise unremarkable. Overall, patient appears well, nontoxic and is appropriate for discharge to follow-up with PCP/psychiatry tomorrow for reevaluation. Patient and family at bedside agrees with plan. No evidence of other acute or emergent pathology at this time. Final diagnoses:  Depression        Joycie PeekBenjamin Cartner, PA-C 09/18/15 1559  Bethann BerkshireJoseph Zammit, MD 09/18/15 [phone]

## 2015-12-04 ENCOUNTER — Emergency Department (HOSPITAL_BASED_OUTPATIENT_CLINIC_OR_DEPARTMENT_OTHER)
Admission: EM | Admit: 2015-12-04 | Discharge: 2015-12-05 | Disposition: A | Discharge status: Home / Self Care | Payer: Managed Care, Other (non HMO) | Attending: Emergency Medicine | Admitting: Emergency Medicine

## 2015-12-04 ENCOUNTER — Emergency Department (HOSPITAL_BASED_OUTPATIENT_CLINIC_OR_DEPARTMENT_OTHER): Payer: Managed Care, Other (non HMO)

## 2015-12-04 ENCOUNTER — Encounter (HOSPITAL_BASED_OUTPATIENT_CLINIC_OR_DEPARTMENT_OTHER): Payer: Self-pay | Admitting: Emergency Medicine

## 2015-12-04 DIAGNOSIS — I10 Essential (primary) hypertension: Secondary | ICD-10-CM | POA: Diagnosis not present

## 2015-12-04 DIAGNOSIS — Z8659 Personal history of other mental and behavioral disorders: Secondary | ICD-10-CM | POA: Diagnosis not present

## 2015-12-04 DIAGNOSIS — R42 Dizziness and giddiness: Secondary | ICD-10-CM | POA: Diagnosis not present

## 2015-12-04 DIAGNOSIS — E119 Type 2 diabetes mellitus without complications: Secondary | ICD-10-CM | POA: Diagnosis not present

## 2015-12-04 DIAGNOSIS — R197 Diarrhea, unspecified: Secondary | ICD-10-CM | POA: Diagnosis not present

## 2015-12-04 DIAGNOSIS — G43109 Migraine with aura, not intractable, without status migrainosus: Secondary | ICD-10-CM | POA: Insufficient documentation

## 2015-12-04 DIAGNOSIS — R112 Nausea with vomiting, unspecified: Secondary | ICD-10-CM | POA: Insufficient documentation

## 2015-12-04 DIAGNOSIS — Z79899 Other long term (current) drug therapy: Secondary | ICD-10-CM | POA: Insufficient documentation

## 2015-12-04 LAB — BASIC METABOLIC PANEL
Anion gap: 13 (ref 5–15)
Anion gap: 9 (ref 5–15)
BUN: 12 mg/dL (ref 6–20)
BUN: 11 mg/dL (ref 6–20)
CO2: 20 mmol/L — ABNORMAL LOW (ref 22–32)
CO2: 21 mmol/L — ABNORMAL LOW (ref 22–32)
Calcium: 9 mg/dL (ref 8.9–10.3)
Calcium: 8.4 mg/dL — ABNORMAL LOW (ref 8.9–10.3)
Chloride: 97 mmol/L — ABNORMAL LOW (ref 101–111)
Chloride: 101 mmol/L (ref 101–111)
Creatinine, Ser: 0.9 mg/dL (ref 0.61–1.24)
Creatinine, Ser: 0.91 mg/dL (ref 0.61–1.24)
GFR calc Af Amer: >60 mL/min (ref >60)
GFR calc Af Amer: >60 mL/min (ref >60)
GFR calc non Af Amer: >60 mL/min (ref >60)
GFR calc non Af Amer: >60 mL/min (ref >60)
Glucose, Bld: 267 mg/dL — ABNORMAL HIGH (ref 65–99)
Glucose, Bld: 245 mg/dL — ABNORMAL HIGH (ref 65–99)
Potassium: 4.2 mmol/L (ref 3.5–5.1)
Potassium: 4.3 mmol/L (ref 3.5–5.1)
Sodium: 130 mmol/L — ABNORMAL LOW (ref 135–145)
Sodium: 131 mmol/L — ABNORMAL LOW (ref 135–145)

## 2015-12-04 LAB — URINALYSIS, ROUTINE W REFLEX MICROSCOPIC
Bilirubin Urine: NEGATIVE
Glucose, UA: >1000 mg/dL — AB
Ketones, ur: >80 mg/dL — AB
Leukocytes, UA: NEGATIVE
Nitrite: NEGATIVE
Protein, ur: >300 mg/dL — AB
Specific Gravity, Urine: 1.035 — ABNORMAL HIGH (ref 1.005–1.030)
pH: 5 (ref 5.0–8.0)

## 2015-12-04 LAB — CBC WITH DIFFERENTIAL/PLATELET
Basophils Absolute: 0 10*3/uL (ref 0.0–0.1)
Basophils Relative: 0 %
Eosinophils Absolute: 0.1 10*3/uL (ref 0.0–0.7)
Eosinophils Relative: 1 %
HCT: 44.2 % (ref 39.0–52.0)
Hemoglobin: 14.9 g/dL (ref 13.0–17.0)
Lymphocytes Relative: 22 %
Lymphs Abs: 2.6 10*3/uL (ref 0.7–4.0)
MCH: 27.8 pg (ref 26.0–34.0)
MCHC: 33.7 g/dL (ref 30.0–36.0)
MCV: 82.5 fL (ref 78.0–100.0)
Monocytes Absolute: 0.5 10*3/uL (ref 0.1–1.0)
Monocytes Relative: 5 %
Neutro Abs: 8.4 10*3/uL — ABNORMAL HIGH (ref 1.7–7.7)
Neutrophils Relative %: 72 %
Platelets: 273 10*3/uL (ref 150–400)
RBC: 5.36 MIL/uL (ref 4.22–5.81)
RDW: 13.2 % (ref 11.5–15.5)
WBC: 11.6 10*3/uL — ABNORMAL HIGH (ref 4.0–10.5)

## 2015-12-04 LAB — CBG MONITORING, ED
Glucose-Capillary: 275 mg/dL — ABNORMAL HIGH (ref 65–99)
Glucose-Capillary: 243 mg/dL — ABNORMAL HIGH (ref 65–99)

## 2015-12-04 LAB — URINE MICROSCOPIC-ADD ON: RBC / HPF: NONE SEEN RBC/hpf (ref 0–5)

## 2015-12-04 MED ORDER — ONDANSETRON HCL 4 MG/2ML IJ SOLN
4.0000 mg | Freq: Once | INTRAMUSCULAR | Status: AC
  Administered 2015-12-05: 4 mg via INTRAVENOUS
  Filled 2015-12-04: qty 2

## 2015-12-04 MED ORDER — BENZONATATE 100 MG PO CAPS: 100.0000 mg | ORAL_CAPSULE | Freq: Three times a day (TID) | ORAL | Status: DC

## 2015-12-04 MED ORDER — PROMETHAZINE HCL 25 MG PO TABS: 25.0000 mg | ORAL_TABLET | Freq: Four times a day (QID) | ORAL | Status: DC | PRN

## 2015-12-04 MED ORDER — PROMETHAZINE HCL 25 MG RE SUPP: 25.0000 mg | Freq: Four times a day (QID) | RECTAL | Status: DC | PRN

## 2015-12-04 MED ORDER — ALBUTEROL SULFATE HFA 108 (90 BASE) MCG/ACT IN AERS: 1.0000 | INHALATION_SPRAY | Freq: Four times a day (QID) | RESPIRATORY_TRACT | Status: DC | PRN

## 2015-12-04 MED ORDER — SODIUM CHLORIDE 0.9 % IV BOLUS (SEPSIS)
1000.0000 mL | Freq: Once | INTRAVENOUS | Status: AC
  Administered 2015-12-04: 1000 mL via INTRAVENOUS

## 2015-12-04 NOTE — Discharge Instructions (Signed)
Schedule a follow up appointment with your PCP. Stay well hydrated. Use phenergan as needed for nausea.  Nausea and Vomiting Nausea is a sick feeling that often comes before throwing up (vomiting). Vomiting is a reflex where stomach contents come out of your mouth. Vomiting can cause severe loss of body fluids (dehydration). Children and elderly adults can become dehydrated quickly, especially if they also have diarrhea. Nausea and vomiting are symptoms of a condition or disease. It is important to find the cause of your symptoms. CAUSES   Direct irritation of the stomach lining. This irritation can result from increased acid production (gastroesophageal reflux disease), infection, food poisoning, taking certain medicines (such as nonsteroidal anti-inflammatory drugs), alcohol use, or tobacco use.  Signals from the brain.These signals could be caused by a headache, heat exposure, an inner ear disturbance, increased pressure in the brain from injury, infection, a tumor, or a concussion, pain, emotional stimulus, or metabolic problems.  An obstruction in the gastrointestinal tract (bowel obstruction).  Illnesses such as diabetes, hepatitis, gallbladder problems, appendicitis, kidney problems, cancer, sepsis, atypical symptoms of a heart attack, or eating disorders.  Medical treatments such as chemotherapy and radiation.  Receiving medicine that makes you sleep (general anesthetic) during surgery. DIAGNOSIS Your caregiver may ask for tests to be done if the problems do not improve after a few days. Tests may also be done if symptoms are severe or if the reason for the nausea and vomiting is not clear. Tests may include:  Urine tests.  Blood tests.  Stool tests.  Cultures (to look for evidence of infection).  X-rays or other imaging studies. Test results can help your caregiver make decisions about treatment or the need for additional tests. TREATMENT You need to stay well hydrated. Drink  frequently but in small amounts.You may wish to drink water, sports drinks, clear broth, or eat frozen ice pops or gelatin dessert to help stay hydrated.When you eat, eating slowly may help prevent nausea.There are also some antinausea medicines that may help prevent nausea. HOME CARE INSTRUCTIONS   Take all medicine as directed by your caregiver.  If you do not have an appetite, do not force yourself to eat. However, you must continue to drink fluids.  If you have an appetite, eat a normal diet unless your caregiver tells you differently.  Eat a variety of complex carbohydrates (rice, wheat, potatoes, bread), lean meats, yogurt, fruits, and vegetables.  Avoid high-fat foods because they are more difficult to digest.  Drink enough water and fluids to keep your urine clear or pale yellow.  If you are dehydrated, ask your caregiver for specific rehydration instructions. Signs of dehydration may include:  Severe thirst.  Dry lips and mouth.  Dizziness.  Dark urine.  Decreasing urine frequency and amount.  Confusion.  Rapid breathing or pulse. SEEK IMMEDIATE MEDICAL CARE IF:   You have blood or brown flecks (like coffee grounds) in your vomit.  You have black or bloody stools.  You have a severe headache or stiff neck.  You are confused.  You have severe abdominal pain.  You have chest pain or trouble breathing.  You do not urinate at least once every 8 hours.  You develop cold or clammy skin.  You continue to vomit for longer than 24 to 48 hours.  You have a fever. MAKE SURE YOU:   Understand these instructions.  Will watch your condition.  Will get help right away if you are not doing well or get worse.  This information is not intended to replace advice given to you by your health care provider. Make sure you discuss any questions you have with your health care provider.   Document Released: 11/19/2005 Document Revised: 02/11/2012 Document Reviewed:  04/18/2011 Elsevier Interactive Patient Education [employer].

## 2015-12-04 NOTE — ED Provider Notes (Signed)
CSN: [id number]     Arrival date & time 12/04/15  1534 History   First MD Initiated Contact with Patient 12/04/15 1811     Chief Complaint  Patient presents with  . Emesis   HPI  Mr. Johnathan Steele is a 40 year old male with PMHx of HTN, migraine and DM presenting with vomiting. He states that he began feeling nauseous yesterday and has been intermittently vomiting since. No blood in his vomit He is also complaining of lightheadedness upon standing. He was seen at [facility] yesterday for vomiting. He was given fluids and discharged with zofran. He reports good relief from nausea and vomiting with zofran but states the symptoms return after a few hours. He denies associated abdominal pain with the nausea and vomiting. He endorses diarrhea but states this is chronic due to his metformin use. He is concerned because his blood sugar was in the 300s at home for the past week. He does not take insulin and reports his blood sugar is usually in the lower 100s. Denies fevers, chills, headache, syncope, chest pain, SOB, cough, dysuria, flank pain, weakness or myalgias.   Past Medical History  Diagnosis Date  . Hypertension   . Migraines   . Migraine   . Diabetes mellitus   . Migraine with aura 09/08/2013  . OSA (obstructive sleep apnea) 09/08/2013  . Depression    Past Surgical History  Procedure Laterality Date  . Shoulder surgery    . Skin biopsy     History reviewed. No pertinent family history. Social History  Substance Use Topics  . Smoking status: Never Smoker   . Smokeless tobacco: None  . Alcohol Use: No    Review of Systems  Constitutional: Negative for fever and chills.  HENT: Negative.   Eyes: Negative for visual disturbance.  Respiratory: Negative for cough and shortness of breath.   Cardiovascular: Negative for chest pain.  Gastrointestinal: Positive for nausea, vomiting and diarrhea (chronic). Negative for abdominal pain.  Genitourinary: Negative.   Neurological: Positive  for light-headedness. Negative for syncope, weakness and headaches.  All other systems reviewed and are negative.     Allergies  Other and Topamax  Home Medications   Prior to Admission medications   Medication Sig Start Date End Date Taking? Authorizing Provider  losartan (COZAAR) 25 MG tablet Take 25 mg by mouth daily.   Yes Historical Provider, MD  metFORMIN (GLUCOPHAGE) 1000 MG tablet Take 1 tablet (1,000 mg total) by mouth 2 (two) times daily. 03/12/12 12/04/15 Yes Emeline General, MD  metoprolol (LOPRESSOR) 100 MG tablet Take 100 mg by mouth 2 (two) times daily.     Yes Historical Provider, MD  metoprolol succinate (TOPROL-XL) 100 MG 24 hr tablet Take 1 tablet by mouth 2 (two) times daily. 06/14/13  Yes Historical Provider, MD  cyclobenzaprine (FLEXERIL) 10 MG tablet Take 10 mg by mouth 3 (three) times daily as needed for muscle spasms.    Historical Provider, MD  ibuprofen (ADVIL,MOTRIN) 200 MG tablet Take 200 mg by mouth every 6 (six) hours as needed. Patient used this medication for his headache.    Historical Provider, MD  promethazine (PHENERGAN) 25 MG suppository Place 1 suppository (25 mg total) rectally every 6 (six) hours as needed for nausea or vomiting. 12/04/15   Stevi Barrett, PA-C  promethazine (PHENERGAN) 25 MG tablet Take 1 tablet (25 mg total) by mouth every 6 (six) hours as needed for nausea or vomiting. 12/04/15   Stevi Barrett, PA-C   BP  139/72 mmHg  Pulse 98  Temp(Src) 98.1 F (36.7 C) (Oral)  Resp 18  Ht 6\' 1"  (1.854 m)  Wt 140.615 kg  BMI 40.91 kg/m2  SpO2 97% Physical Exam  Constitutional: He appears well-developed and well-nourished. No distress.  HENT:  Head: Normocephalic and atraumatic.  Eyes: Conjunctivae are normal. Right eye exhibits no discharge. Left eye exhibits no discharge. No scleral icterus.  Neck: Normal range of motion. Neck supple.  Cardiovascular: Normal rate, regular rhythm and normal heart sounds.   Pulmonary/Chest: Effort normal and  breath sounds normal. No respiratory distress. He has no wheezes.  Abdominal: Soft. Bowel sounds are normal. He exhibits no distension. There is no tenderness. There is no rebound and no guarding.  Musculoskeletal: Normal range of motion.  Neurological: He is alert. Coordination normal.  Skin: Skin is warm and dry.  Psychiatric: He has a normal mood and affect. His behavior is normal.  Nursing note and vitals reviewed.   ED Course  Procedures (including critical care time) Labs Review Labs Reviewed  URINALYSIS, ROUTINE W REFLEX MICROSCOPIC (NOT AT [facility]) - Abnormal; Notable for the following:    Specific Gravity, Urine 1.035 (*)    Glucose, UA >1000 (*)    Hgb urine dipstick SMALL (*)    Ketones, ur >80 (*)    Protein, ur >300 (*)    All other components within normal limits  URINE MICROSCOPIC-ADD ON - Abnormal; Notable for the following:    Squamous Epithelial / LPF 0-5 (*)    Bacteria, UA RARE (*)    Casts HYALINE CASTS (*)    All other components within normal limits  CBC WITH DIFFERENTIAL/PLATELET - Abnormal; Notable for the following:    WBC 11.6 (*)    Neutro Abs 8.4 (*)    All other components within normal limits  BASIC METABOLIC PANEL - Abnormal; Notable for the following:    Sodium 130 (*)    Chloride 97 (*)    CO2 20 (*)    Glucose, Bld 267 (*)    All other components within normal limits  BASIC METABOLIC PANEL - Abnormal; Notable for the following:    Sodium 131 (*)    CO2 21 (*)    Glucose, Bld 245 (*)    Calcium 8.4 (*)    All other components within normal limits  CBG MONITORING, ED - Abnormal; Notable for the following:    Glucose-Capillary 275 (*)    All other components within normal limits  CBG MONITORING, ED - Abnormal; Notable for the following:    Glucose-Capillary 243 (*)    All other components within normal limits    Imaging Review Dg Abd Acute W/chest  12/04/2015  CLINICAL DATA:  Patient with vomiting for 2 days. EXAM: DG ABDOMEN ACUTE W/ 1V  CHEST COMPARISON:  Chest radiograph 09/14/2012. FINDINGS: Stable cardiac and mediastinal contours. No consolidative pulmonary opacities. No pleural effusion or pneumothorax. Gas is demonstrated within nondilated loops of large and small bowel in a nonobstructed pattern. No aggressive or acute appearing osseous lesions. IMPRESSION: Nonobstructed bowel gas pattern. No acute cardiopulmonary process. Electronically Signed   By: Annia Beltrew  Davis M.D.   On: 12/04/2015 21:49   I have personally reviewed and evaluated these images and lab results as part of my medical decision-making.   EKG Interpretation None      MDM   Final diagnoses:  Non-intractable vomiting with nausea, vomiting of unspecified type    40 year old male presenting with nausea and vomiting  x 2 days. He was [facility] at [facility] for same complaints, treated with fluids and discharged with zofran. He reports good symptom improvement with zofran but states symptoms return after a few hours. VSS. Pt is nontoxic appearing. Abdomen is soft, non-tender without peritoneal signs. BG 243. WBC 11.6 likely from viral stomach bug. No anion gap elevation. Acute abd series negative. Pt's nausea controlled with zofran in ED without episodes of vomiting or diarrhea in ED. Pt likely has viral illness causing nausea and vomiting. Chart review shows that PCP believes addition of abilify to pt's medications is cause for increased blood sugar and is planning to add second glucose control medication. Elevated BG may also be affected by viral illness. Pt is requesting different nausea medication; will d/c with phenergan. Pt is to follow up with PCP in 1 week. Discussed importance of hydration. Return precautions given in discharge paperwork and discussed with pt at bedside. Pt stable for discharge     Alveta Heimlich, PA-C 12/05/15 0110  Nelva Nay, MD 12/05/15 [phone]

## 2015-12-04 NOTE — ED Notes (Signed)
Pt resting comfortably with family at bedside. Delay explained will continue to monitor

## 2015-12-04 NOTE — ED Notes (Signed)
Pt was seen at [facility] yesterday given 2 bags of fluids and insulin, called rn today at md office and told to come back due to continued vomiting

## 2015-12-04 NOTE — ED Notes (Signed)
Family at bedside. 

## 2015-12-04 NOTE — ED Notes (Signed)
Patient is resting comfortably. 

## 2016-05-23 ENCOUNTER — Emergency Department (HOSPITAL_BASED_OUTPATIENT_CLINIC_OR_DEPARTMENT_OTHER): Payer: Managed Care, Other (non HMO)

## 2016-05-23 ENCOUNTER — Other Ambulatory Visit: Payer: Self-pay

## 2016-05-23 ENCOUNTER — Emergency Department (HOSPITAL_BASED_OUTPATIENT_CLINIC_OR_DEPARTMENT_OTHER)
Admission: EM | Admit: 2016-05-23 | Discharge: 2016-05-23 | Disposition: A | Discharge status: Home / Self Care | Payer: Managed Care, Other (non HMO) | Attending: Emergency Medicine | Admitting: Emergency Medicine

## 2016-05-23 ENCOUNTER — Encounter (HOSPITAL_BASED_OUTPATIENT_CLINIC_OR_DEPARTMENT_OTHER): Payer: Self-pay

## 2016-05-23 DIAGNOSIS — I10 Essential (primary) hypertension: Secondary | ICD-10-CM | POA: Insufficient documentation

## 2016-05-23 DIAGNOSIS — R002 Palpitations: Secondary | ICD-10-CM | POA: Insufficient documentation

## 2016-05-23 DIAGNOSIS — E119 Type 2 diabetes mellitus without complications: Secondary | ICD-10-CM | POA: Insufficient documentation

## 2016-05-23 DIAGNOSIS — F329 Major depressive disorder, single episode, unspecified: Secondary | ICD-10-CM | POA: Insufficient documentation

## 2016-05-23 DIAGNOSIS — Z79899 Other long term (current) drug therapy: Secondary | ICD-10-CM | POA: Diagnosis not present

## 2016-05-23 DIAGNOSIS — R0989 Other specified symptoms and signs involving the circulatory and respiratory systems: Secondary | ICD-10-CM | POA: Insufficient documentation

## 2016-05-23 DIAGNOSIS — R0602 Shortness of breath: Secondary | ICD-10-CM | POA: Diagnosis present

## 2016-05-23 DIAGNOSIS — Z7984 Long term (current) use of oral hypoglycemic drugs: Secondary | ICD-10-CM | POA: Insufficient documentation

## 2016-05-23 LAB — CBC WITH DIFFERENTIAL/PLATELET
Basophils Absolute: 0 10*3/uL (ref 0.0–0.1)
Basophils Relative: 0 %
Eosinophils Absolute: 0.1 10*3/uL (ref 0.0–0.7)
Eosinophils Relative: 1 %
HCT: 41.8 % (ref 39.0–52.0)
Hemoglobin: 14.1 g/dL (ref 13.0–17.0)
Lymphocytes Relative: 20 %
Lymphs Abs: 1.9 10*3/uL (ref 0.7–4.0)
MCH: 28.4 pg (ref 26.0–34.0)
MCHC: 33.7 g/dL (ref 30.0–36.0)
MCV: 84.3 fL (ref 78.0–100.0)
Monocytes Absolute: 0.7 10*3/uL (ref 0.1–1.0)
Monocytes Relative: 7 %
Neutro Abs: 6.5 10*3/uL (ref 1.7–7.7)
Neutrophils Relative %: 72 %
Platelets: 282 10*3/uL (ref 150–400)
RBC: 4.96 MIL/uL (ref 4.22–5.81)
RDW: 13.1 % (ref 11.5–15.5)
WBC: 9.1 10*3/uL (ref 4.0–10.5)

## 2016-05-23 LAB — D-DIMER, QUANTITATIVE: D-Dimer, Quant: <0.27 ug/mL-FEU (ref 0.00–0.50)

## 2016-05-23 LAB — BASIC METABOLIC PANEL
Anion gap: 9 (ref 5–15)
BUN: 12 mg/dL (ref 6–20)
CO2: 24 mmol/L (ref 22–32)
Calcium: 8.8 mg/dL — ABNORMAL LOW (ref 8.9–10.3)
Chloride: 103 mmol/L (ref 101–111)
Creatinine, Ser: 0.89 mg/dL (ref 0.61–1.24)
GFR calc Af Amer: >60 mL/min (ref >60)
GFR calc non Af Amer: >60 mL/min (ref >60)
Glucose, Bld: 145 mg/dL — ABNORMAL HIGH (ref 65–99)
Potassium: 3.5 mmol/L (ref 3.5–5.1)
Sodium: 136 mmol/L (ref 135–145)

## 2016-05-23 LAB — TROPONIN I: Troponin I: <0.03 ng/mL (ref <0.031)

## 2016-05-23 LAB — BRAIN NATRIURETIC PEPTIDE: B Natriuretic Peptide: 15.2 pg/mL (ref 0.0–100.0)

## 2016-05-23 NOTE — ED Notes (Signed)
Pt sitting up playing on phone. Pt is in NAD, speaking in complete sentences.

## 2016-05-23 NOTE — ED Provider Notes (Signed)
CSN: [id number]     Arrival date & time 05/23/16  1616 History   First MD Initiated Contact with Patient 05/23/16 1629     Chief Complaint  Patient presents with  . Shortness of Breath     (Consider location/radiation/quality/duration/timing/severity/associated sxs/prior Treatment) Patient is a 40 y.o. male presenting with shortness of breath. The history is provided by the patient and medical records.  Shortness of Breath   40 y.o. M with hx of THN, migraine headaches, DM, OSA, depression, presenting to the ED for SOB and palpitations over the past week.  He states initially symptoms were more so with movement and activity but now he has become SOB even at rest.  He denies any cough, fever, or other URI symptoms.  Denies chest pain but states he does have some discomfort in his right shoulder blade-- states it feels like a nagging ache.  No known cardiac hx.  States his grandmother did have some heart disease.  Patient has never been a smoker. No hx of asthma or COPD.  No hx of DVT or PE.  No recent travel, surgeries, prolonged immobilization.  No leg swelling or calf pain.  No significant weight gain or night time orthopnea.  Does have sleep apnea, uses CPAP most nights.  Past Medical History  Diagnosis Date  . Hypertension   . Migraines   . Migraine   . Diabetes mellitus   . Migraine with aura 09/08/2013  . OSA (obstructive sleep apnea) 09/08/2013  . Depression    Past Surgical History  Procedure Laterality Date  . Shoulder surgery    . Skin biopsy     No family history on file. Social History  Substance Use Topics  . Smoking status: Never Smoker   . Smokeless tobacco: None  . Alcohol Use: No    Review of Systems  Respiratory: Positive for shortness of breath.   Cardiovascular: Positive for palpitations.  All other systems reviewed and are negative.     Allergies  Other and Topamax  Home Medications   Prior to Admission medications   Medication Sig Start Date End  Date Taking? Authorizing Provider  cyclobenzaprine (FLEXERIL) 10 MG tablet Take 10 mg by mouth 3 (three) times daily as needed for muscle spasms.    Historical Provider, MD  ibuprofen (ADVIL,MOTRIN) 200 MG tablet Take 200 mg by mouth every 6 (six) hours as needed. Patient used this medication for his headache.    Historical Provider, MD  losartan (COZAAR) 25 MG tablet Take 25 mg by mouth daily.    Historical Provider, MD  metFORMIN (GLUCOPHAGE) 1000 MG tablet Take 1 tablet (1,000 mg total) by mouth 2 (two) times daily. 03/12/12 12/04/15  Emeline General, MD  metoprolol (LOPRESSOR) 100 MG tablet Take 100 mg by mouth 2 (two) times daily.      Historical Provider, MD  metoprolol succinate (TOPROL-XL) 100 MG 24 hr tablet Take 1 tablet by mouth 2 (two) times daily. 06/14/13   Historical Provider, MD   BP 154/97 mmHg  Pulse 110  Temp(Src) 98.4 F (36.9 C)  Resp 26  Ht  (1.854 m)  Wt 142.883 kg  BMI 41.57 kg/m2  SpO2 99%   Physical Exam  Constitutional: He is oriented to person, place, and time. He appears well-developed and well-nourished.  Morbidly obese  HENT:  Head: Normocephalic and atraumatic.  Mouth/Throat: Oropharynx is clear and moist.  Eyes: Conjunctivae and EOM are normal. Pupils are equal, round, and reactive to light.  Neck: Normal  range of motion.  Cardiovascular: Normal rate, regular rhythm and normal heart sounds.   Pulmonary/Chest: Effort normal. No respiratory distress. He has decreased breath sounds. He has no wheezes. He has no rhonchi.  Normal work of breathing, able to speak in full sentences, lung sounds diminished throughout, no wheezes or rhonchi  Abdominal: Soft. Bowel sounds are normal. There is no tenderness. There is no guarding.  Musculoskeletal: Normal range of motion.  No pitting edema No calf swelling, tenderness, or palpable cords, no overlying skin changes  Neurological: He is alert and oriented to person, place, and time.  Skin: Skin is warm and dry.   Psychiatric: He has a normal mood and affect.  Nursing note and vitals reviewed.   ED Course  Procedures (including critical care time) Labs Review Labs Reviewed  BASIC METABOLIC PANEL - Abnormal; Notable for the following:    Glucose, Bld 145 (*)    Calcium 8.8 (*)    All other components within normal limits  CBC WITH DIFFERENTIAL/PLATELET  TROPONIN I  BRAIN NATRIURETIC PEPTIDE  D-DIMER, QUANTITATIVE (NOT AT [facility])    Imaging Review Dg Chest 2 View  05/23/2016  CLINICAL DATA:  Increasing shortness of breath and palpitations for 1 week EXAM: CHEST  2 VIEW COMPARISON:  12/04/2015 FINDINGS: The heart size and mediastinal contours are within normal limits. Pulmonary vascular congestion noted. No pleural effusion or overt edema. Both lungs are clear. The visualized skeletal structures are unremarkable. IMPRESSION: 1.  Pulmonary vascular congestion.  No overt heart failure. Electronically Signed   By: Signa Kellaylor  Stroud M.D.   On: 05/23/2016 17:00   I have personally reviewed and evaluated these images and lab results as part of my medical decision-making.   EKG Interpretation   Date/Time:  Wednesday May 23 2016 16:21:43 EDT Ventricular Rate:  96 PR Interval:  160 QRS Duration: 110 QT Interval:  366 QTC Calculation: 462 R Axis:   90 Text Interpretation:  Normal sinus rhythm Rightward axis Cannot rule out  Anterior infarct , age undetermined Abnormal ECG Confirmed by Rubin PayorPICKERING   MD, NATHAN [phone]([id number]) on 05/23/2016 5:59:52 PM      MDM   Final diagnoses:  Pulmonary vascular congestion  Shortness of breath   40 y.o. M here with worsening SOB over the past week.  No chest pain.  Normal work of breathing on exam, lung sound diminished but overall clear.  EKG non-ischemic.  Lab work reassuring-- trop neg, BNP WNL, d-dimer negative.  CXR with some vascular congestion noted, no edema.  Patient's VS have remained stable on RA.  He remains in NAD.  Patient with what appears to be some new  onset CHF.  Do not feel he requires lasix or admission at this time but would likely benefit from OP cardiology evaluation.  [facility]  Discussed plan with patient, he/she acknowledged understanding and agreed with plan of care.  Return precautions given for new or worsening symptoms.    Garlon HatchetLisa M Sanders, PA-C 05/23/16 1927  Benjiman CoreNathan Pickering, MD 05/23/16 2142

## 2016-05-23 NOTE — Discharge Instructions (Signed)
Recommend to follow-up with cardiology-- call tomorrow to make appt. Return here for new concerns.

## 2016-05-23 NOTE — ED Notes (Addendum)
Patient here with increasing shortness of breath and palpitations x 1 week.  Shortness of breath with any exertion that is getting worse per patient. Denies CP. Slightly diaphoretic on arrival, denies swelling

## 2016-05-23 NOTE — ED Notes (Signed)
PA-C at bedside 

## 2016-05-30 ENCOUNTER — Ambulatory Visit (INDEPENDENT_AMBULATORY_CARE_PROVIDER_SITE_OTHER): Payer: Managed Care, Other (non HMO) | Admitting: Cardiology

## 2016-05-30 ENCOUNTER — Encounter: Payer: Self-pay | Admitting: Cardiology

## 2016-05-30 VITALS — BP 122/94 | HR 94 | Ht 73.0 in | Wt 325.2 lb

## 2016-05-30 DIAGNOSIS — R0602 Shortness of breath: Secondary | ICD-10-CM | POA: Diagnosis not present

## 2016-05-30 NOTE — Progress Notes (Signed)
05/30/2016 Alcide Evenerhristopher A Gustafson   09/02/1976  [id number]  Primary Physician Angelica ChessmanAGUIAR,RAFAELA M., MD Primary Cardiologist: New (Discussed with Dr. Johney FrameAllred, DOD)   Reason for Visit/CC: [facility] for SOB.  HPI:  The patient is a 40 year old African-American male, new to our practice. He has been referred by [facility] for cardiac evaluation given recent symptoms of dyspnea. He is followed medically by [facility]. Per records, he has a history of poorly controlled type 2 diabetes with recent hemoglobin A1c of 9.5. He also has a history of hypertension and hyperlipidemia. His last lipid panel showed an elevated LDL of 124 mg/dL. HDL was low at 30 mg/dL. Triglycerides were extremely elevated at 1210 mg/dL. Unfortunately he is not on statin therapy due to a history of elevated LFTs/fatty liver disease.  He was recently seen at [facility] 05/23/2016. He presented with complaints of worsening shortness of breath over the last several weeks. He denied chest pain. Based on H&P, he had normal breathing on exam, lung sounds were diminished but overall clear. His EKG was nonischemic. Lab workup was reassuring including negative troponin, normal BNP and negative d-dimer. CBC was negative for anemia. Hgb was 14.1. His chest x-ray showed some vascular congestion but no edema. Vital signs were stable. He was seen by Ileene HutchinsonLisa Saunders, PA-C, who felt that he likely had new onset CHF. She did not feel that he required Lasix or admission but did recommend that he follow-up in our clinic for further cardiac evaluation.  He presents to clinic today accompanied by his wife. He reports improvement with his breathing. He denies any resting dyspnea. He has mild exertional dyspnea with moderate physical activity. His main complaint is exertional fatigue. He continues to deny any chest discomfort. He denies any history of tobacco abuse. His family history is only notable for  myocardial infarction in his maternal grandmother, however she was in her 2370s when this happened. No other family history of CAD nor SCD that he is aware.   Current Outpatient Prescriptions  Medication Sig Dispense Refill  . venlafaxine XR (EFFEXOR-XR) 150 MG 24 hr capsule Take 150 mg by mouth 2 (two) times daily.    . cyclobenzaprine (FLEXERIL) 10 MG tablet Take 10 mg by mouth 3 (three) times daily as needed for muscle spasms.    Marland Kitchen. ibuprofen (ADVIL,MOTRIN) 200 MG tablet Take 200 mg by mouth every 6 (six) hours as needed. Patient used this medication for his headache.    . losartan (COZAAR) 25 MG tablet Take 25 mg by mouth daily.    . metFORMIN (GLUCOPHAGE) 1000 MG tablet Take 1 tablet (1,000 mg total) by mouth 2 (two) times daily. 60 tablet 0  . metoprolol (LOPRESSOR) 100 MG tablet Take 100 mg by mouth 2 (two) times daily.      . [DISCONTINUED] albuterol (PROVENTIL HFA;VENTOLIN HFA) 108 (90 Base) MCG/ACT inhaler Inhale 1-2 puffs into the lungs every 6 (six) hours as needed for wheezing or shortness of breath. 1 Inhaler 0   No current facility-administered medications for this visit.    Allergies  Allergen Reactions  . Other     immitrex   . Topamax [Topiramate] Other (See Comments)    Cognitive skills deteriorate.  Reflex's slow down markedly.    Social History   Social History  . Marital Status: Married    Spouse Name: N/A  . Number of Children: N/A  . Years of Education: N/A   Occupational History  .  Not on file.   Social History Main Topics  . Smoking status: Never Smoker   . Smokeless tobacco: Not on file  . Alcohol Use: No  . Drug Use: No  . Sexual Activity: Not on file   Other Topics Concern  . Not on file   Social History Narrative     Review of Systems: General: negative for chills, fever, night sweats or weight changes.  Cardiovascular: negative for chest pain, dyspnea on exertion, edema, orthopnea, palpitations, paroxysmal nocturnal dyspnea or shortness  of breath Dermatological: negative for rash Respiratory: negative for cough or wheezing Urologic: negative for hematuria Abdominal: negative for nausea, vomiting, diarrhea, bright red blood per rectum, melena, or hematemesis Neurologic: negative for visual changes, syncope, or dizziness All other systems reviewed and are otherwise negative except as noted above.    Height 6\' 1"  (1.854 m).  General appearance: alert, cooperative, no distress and obsese Neck: no adenopathy, no carotid bruit and no JVD Lungs: clear to auscultation bilaterally Heart: regular rate and rhythm, S1, S2 normal, no murmur, click, rub or gallop Extremities: no LEE Pulses: 2+ and symmetric Skin: warm and dry Neurologic: Grossly normal  EKG urgent care EKG reviewed. NSR. No ischemia.   ASSESSMENT AND PLAN:   1. Dyspnea/ Exertional Fatigue: Cardiac risk factors include obesity, poorly controlled diabetes, hypertension and hyperlipidemia. No significant family history of premature CAD or sudden cardiac death. No history of tobacco use. He denies any anginal symptomatology including no chest discomfort. He has mild dyspnea with moderate physical activity but his main complaint is exertional fatigue. Workup at [facility] was unrevealing. His EKG was nonischemic. Lab workup was reassuring including negative troponin, normal BNP and negative d-dimer. CBC was negative for anemia. Hemoglobin was 14.1.  His chest x-ray showed some vascular congestion but no edema. Vital signs were stable. He was referred by [facility] as there was concerns for possible heart failure. His cardiac exam is normal. Regular rate and rhythm without murmurs. He has decreased breath sounds at the bases but otherwise clear to auscultation bilaterally. No JVD and no lower extremity edema. We will obtain a 2-D echocardiogram to assess left ventricular systolic and diastolic function. Even he has poorly controlled diabetes, we will also  arrange for ischemic evaluation with a meal exercise tolerance test to rule out silent ischemia. I discussed this with Dr. Johney FrameAllred, DOD, who agrees with the above assessment and plan. He has been instructed to continue his current medical regimen which consist of losartan, Metroprolol and metformin. He was also instructed to hold his Metroprolol the morning of his exercise tolerance test.   PLAN  F/u in 2 weeks after exercise tolerance test and 2-D echocardiogram.  Robbie LisBrittainy Simmons PA-C 05/30/2016 11:07 AM

## 2016-05-30 NOTE — Patient Instructions (Addendum)
Medication Instructions:  Your physician recommends that you continue on your current medications as directed. Please refer to the Current Medication list given to you today.   Labwork: None ordered  Testing/Procedures: Your physician has requested that you have an exercise tolerance test. For further information please visit [email or web address]. Please also follow instruction sheet, as given.  Your physician has requested that you have an echocardiogram. Echocardiography is a painless test that uses sound waves to create images of your heart. It provides your doctor with information about the size and shape of your heart and how well your heart's chambers and valves are working. This procedure takes approximately one hour. There are no restrictions for this procedure.    Follow-Up: Your physician recommends that you schedule a follow-up appointment in: WILL BE BASED UPON TEST RESULTS   Any Other Special Instructions Will Be Listed Below (If Applicable). Exercise Stress Electrocardiogram An exercise stress electrocardiogram is a test to check how blood flows to your heart. It is done to find areas of poor blood flow. You will need to walk on a treadmill for this test. The electrocardiogram will record your heartbeat when you are at rest and when you are exercising. BEFORE THE PROCEDURE  Do not have drinks with caffeine or foods with caffeine for 24 hours before the test, or as told by your doctor. This includes coffee, tea (even decaf tea), sodas, chocolate, and cocoa.  Follow your doctor's instructions about eating and drinking before the test.  Ask your doctor what medicines you should or should not take before the test. Take your medicines with water unless told by your doctor not to.  If you use an inhaler, bring it with you to the test.  Bring a snack to eat after the test.  Do not  smoke for 4 hours before the test.  Do not put lotions, powders, creams, or oils on your chest  before the test.  Wear comfortable shoes and clothing. PROCEDURE  You will have patches put on your chest. Small areas of your chest may need to be shaved. Wires will be connected to the patches.  Your heart rate will be watched while you are resting and while you are exercising.  You will walk on the treadmill. The treadmill will slowly get faster to raise your heart rate.  The test will take about 1-2 hours. AFTER THE PROCEDURE  Your heart rate and blood pressure will be watched after the test.  You may return to your normal diet, activities, and medicines or as told by your doctor.   This information is not intended to replace advice given to you by your health care provider. Make sure you discuss any questions you have with your health care provider.   Document Released: 05/07/2008 Document Revised: 12/10/2014 Document Reviewed: 07/27/2013 Elsevier Interactive Patient Education 2016 [employer]   Echocardiogram An echocardiogram, or echocardiography, uses sound waves (ultrasound) to produce an image of your heart. The echocardiogram is simple, painless, obtained within a short period of time, and offers valuable information to your health care provider. The images from an echocardiogram can provide information such as:  Evidence of coronary artery disease (CAD).  Heart size.  Heart muscle function.  Heart valve function.  Aneurysm detection.  Evidence of a past heart attack.  Fluid buildup around the heart.  Heart muscle thickening.  Assess heart valve function. LET [facility]ARE PROVIDER KNOW ABOUT:  Any allergies you have.  All medicines you are taking, including vitamins,  herbs, eye drops, creams, and over-the-counter medicines.  Previous problems you or members of your family have had with the use of anesthetics.  Any blood disorders you have.  Previous surgeries you have had.  Medical conditions you have.  Possibility of pregnancy, if this  applies. BEFORE THE PROCEDURE  No special preparation is needed. Eat and drink normally.  PROCEDURE   In order to produce an image of your heart, gel will be applied to your chest and a wand-like tool (transducer) will be moved over your chest. The gel will help transmit the sound waves from the transducer. The sound waves will harmlessly bounce off your heart to allow the heart images to be captured in real-time motion. These images will then be recorded.  You may need an IV to receive a medicine that improves the quality of the pictures. AFTER THE PROCEDURE You may return to your normal schedule including diet, activities, and medicines, unless your health care provider tells you otherwise.   This information is not intended to replace advice given to you by your health care provider. Make sure you discuss any questions you have with your health care provider.   Document Released: 11/16/2000 Document Revised: 12/10/2014 Document Reviewed: 07/27/2013 Elsevier Interactive Patient Education [employer] Elsevier Inc.

## 2016-06-08 ENCOUNTER — Ambulatory Visit (INDEPENDENT_AMBULATORY_CARE_PROVIDER_SITE_OTHER): Payer: Managed Care, Other (non HMO)

## 2016-06-08 ENCOUNTER — Ambulatory Visit (HOSPITAL_COMMUNITY)
Admission: RE | Admit: 2016-06-08 | Discharge: 2016-06-08 | Disposition: A | Discharge status: Home / Self Care | Payer: Managed Care, Other (non HMO) | Source: Ambulatory Visit | Attending: Cardiology | Admitting: Cardiology

## 2016-06-08 DIAGNOSIS — R0602 Shortness of breath: Secondary | ICD-10-CM

## 2016-06-08 DIAGNOSIS — G4733 Obstructive sleep apnea (adult) (pediatric): Secondary | ICD-10-CM | POA: Diagnosis not present

## 2016-06-08 DIAGNOSIS — E785 Hyperlipidemia, unspecified: Secondary | ICD-10-CM | POA: Insufficient documentation

## 2016-06-08 DIAGNOSIS — E119 Type 2 diabetes mellitus without complications: Secondary | ICD-10-CM | POA: Insufficient documentation

## 2016-06-08 DIAGNOSIS — I071 Rheumatic tricuspid insufficiency: Secondary | ICD-10-CM | POA: Insufficient documentation

## 2016-06-08 DIAGNOSIS — I10 Essential (primary) hypertension: Secondary | ICD-10-CM | POA: Insufficient documentation

## 2016-06-08 LAB — EXERCISE TOLERANCE TEST
Estimated workload: 8.5 METS
Exercise duration (min): 7 min
Exercise duration (sec): 0 s
MPHR: 179 {beats}/min
Peak HR: 171 {beats}/min
Percent HR: 95 %
RPE: 14
Rest HR: 91 {beats}/min

## 2016-06-08 NOTE — Progress Notes (Signed)
  Echocardiogram 2D Echocardiogram has been performed.  Leta JunglingCooper, Tiffany M 06/08/2016, 2:16 PM

## 2016-06-13 ENCOUNTER — Telehealth: Payer: Self-pay | Admitting: Cardiology

## 2016-06-13 NOTE — Telephone Encounter (Signed)
F/u  Pt returning RNphone call- test restults. Please call back and discuss.   

## 2016-06-13 NOTE — Telephone Encounter (Signed)
Pt has been advised of his 2D Echo and stress test results.  He is aware of the recommendations to exercise weekly, eat a heart healthy diet, and to f/u with his pcp re; bp, cholesterol, & dm. Pt aware that no further cardiac w/u is needed at this time and he can fu with us as he feels he needs to. Pt agreeable with this plan.

## 2016-08-05 ENCOUNTER — Emergency Department (HOSPITAL_COMMUNITY)
Admission: EM | Admit: 2016-08-05 | Discharge: 2016-08-06 | Disposition: A | Discharge status: Other Acute Inpatient Hospital | Payer: Managed Care, Other (non HMO) | Attending: Emergency Medicine | Admitting: Emergency Medicine

## 2016-08-05 ENCOUNTER — Encounter (HOSPITAL_COMMUNITY): Payer: Self-pay

## 2016-08-05 DIAGNOSIS — R7401 Elevation of levels of liver transaminase levels: Secondary | ICD-10-CM

## 2016-08-05 DIAGNOSIS — I10 Essential (primary) hypertension: Secondary | ICD-10-CM | POA: Insufficient documentation

## 2016-08-05 DIAGNOSIS — F329 Major depressive disorder, single episode, unspecified: Secondary | ICD-10-CM | POA: Insufficient documentation

## 2016-08-05 DIAGNOSIS — F32A Depression, unspecified: Secondary | ICD-10-CM

## 2016-08-05 DIAGNOSIS — E119 Type 2 diabetes mellitus without complications: Secondary | ICD-10-CM | POA: Insufficient documentation

## 2016-08-05 DIAGNOSIS — R45851 Suicidal ideations: Secondary | ICD-10-CM

## 2016-08-05 DIAGNOSIS — Z7984 Long term (current) use of oral hypoglycemic drugs: Secondary | ICD-10-CM | POA: Insufficient documentation

## 2016-08-05 DIAGNOSIS — Z79899 Other long term (current) drug therapy: Secondary | ICD-10-CM | POA: Insufficient documentation

## 2016-08-05 DIAGNOSIS — R740 Nonspecific elevation of levels of transaminase and lactic acid dehydrogenase [LDH]: Secondary | ICD-10-CM | POA: Insufficient documentation

## 2016-08-05 LAB — COMPREHENSIVE METABOLIC PANEL
ALT: 135 U/L — ABNORMAL HIGH (ref 17–63)
AST: 109 U/L — ABNORMAL HIGH (ref 15–41)
Albumin: 4.3 g/dL (ref 3.5–5.0)
Alkaline Phosphatase: 68 U/L (ref 38–126)
Anion gap: 11 (ref 5–15)
BUN: 8 mg/dL (ref 6–20)
CO2: 23 mmol/L (ref 22–32)
Calcium: 9.3 mg/dL (ref 8.9–10.3)
Chloride: 101 mmol/L (ref 101–111)
Creatinine, Ser: 0.85 mg/dL (ref 0.61–1.24)
GFR calc Af Amer: >60 mL/min (ref >60)
GFR calc non Af Amer: >60 mL/min (ref >60)
Glucose, Bld: 232 mg/dL — ABNORMAL HIGH (ref 65–99)
Potassium: 4.1 mmol/L (ref 3.5–5.1)
Sodium: 135 mmol/L (ref 135–145)
Total Bilirubin: 0.5 mg/dL (ref 0.3–1.2)
Total Protein: 7.6 g/dL (ref 6.5–8.1)

## 2016-08-05 LAB — RAPID URINE DRUG SCREEN, HOSP PERFORMED
Amphetamines: NOT DETECTED
Barbiturates: NOT DETECTED
Benzodiazepines: NOT DETECTED
Cocaine: NOT DETECTED
Opiates: NOT DETECTED
Tetrahydrocannabinol: NOT DETECTED

## 2016-08-05 LAB — ETHANOL: Alcohol, Ethyl (B): <5 mg/dL (ref <5)

## 2016-08-05 LAB — CBC
HCT: 43.7 % (ref 39.0–52.0)
Hemoglobin: 14.7 g/dL (ref 13.0–17.0)
MCH: 29 pg (ref 26.0–34.0)
MCHC: 33.6 g/dL (ref 30.0–36.0)
MCV: 86.2 fL (ref 78.0–100.0)
Platelets: 268 10*3/uL (ref 150–400)
RBC: 5.07 MIL/uL (ref 4.22–5.81)
RDW: 13 % (ref 11.5–15.5)
WBC: 10.8 10*3/uL — ABNORMAL HIGH (ref 4.0–10.5)

## 2016-08-05 LAB — SALICYLATE LEVEL: Salicylate Lvl: <4 mg/dL (ref 2.8–30.0)

## 2016-08-05 LAB — CBG MONITORING, ED: Glucose-Capillary: 210 mg/dL — ABNORMAL HIGH (ref 65–99)

## 2016-08-05 LAB — ACETAMINOPHEN LEVEL: Acetaminophen (Tylenol), Serum: <10 ug/mL — ABNORMAL LOW (ref 10–30)

## 2016-08-05 NOTE — ED Notes (Signed)
MD at bedside. 

## 2016-08-05 NOTE — BH Assessment (Signed)
Tele Assessment Note   Johnathan Steele is an 40 y.o. male who presents to the ED for depression symptoms. Pt endorses suicidal plan and states he thought about staging a robbery and having someone kill him. Pt states this scared him because although he has thought about suicide in the past he has never actually thought of a way to do it so he immediately came to the hospital. Pt denies any SA and reports his triggers are "just stress." Pt reports a desire to stay in bed all day and reports he finds it difficult to find the energy to do anything including tending to personal hygiene, eating, and spending time with his family. Pt endorses multiple depressive symptoms including isolating, insomnia, restlessness, poor appetite, hopelessness, crying spells, and feeling anxious. Pt reports he worries about everything and when his anxiety does not subside it leads to horrible migraines. Pt reports he has been hospitalized in 2016 in [facility] due to feeling depressed and suicidal and states he currently is seeing a therapist for the same reason. When asked about stressors, pt reports "anything could trigger it, just stuff at home." Pt reports his mood is "really really sad for most of the day." Pt denies any past history of abuse or trauma and states he began feeling this way about a week ago with no significant casual factor. Pt denies A/V hallucinations and denies H/I. Pt also denies past history of trauma or abuse and states he has a strong support system at home including his wife and kids.   Pt meets inpatient criteria per Alberteen Sam, NP   Diagnosis: Major Depressive Disorder, Generalized Anxiety Disorder   Past Medical History:  Past Medical History:  Diagnosis Date   Depression    Diabetes mellitus    Hypertension    Migraine    Migraine with aura 09/08/2013   Migraines    OSA (obstructive sleep apnea) 09/08/2013    Past Surgical History:  Procedure Laterality Date   SHOULDER  SURGERY     SKIN BIOPSY      Family History: No family history on file.  Social History:  reports that he has never smoked. He has never used smokeless tobacco. He reports that he does not drink alcohol or use drugs.  Additional Social History:  Alcohol / Drug Use Pain Medications: Pt denies abuse. Prescriptions: Pt denies abuse. Over the Counter: Pt denies abuse. History of alcohol / drug use?: No history of alcohol / drug abuse  CIWA: CIWA-Ar BP: 139/87 Pulse Rate: 99 COWS:    PATIENT STRENGTHS: (choose at least two) Average or above average intelligence Communication skills Financial means Supportive family/friends Work skills  Allergies:  Allergies  Allergen Reactions   Sumatriptan Succinate Other (See Comments)    Hypertension   Topamax [Topiramate] Other (See Comments)    Cognitive skills deteriorate.  Reflex's slow down markedly.    Home Medications:  (Not in a hospital admission)  OB/GYN Status:  No LMP for male patient.  General Assessment Data Location of Assessment: [facility] ED TTS Assessment: In system Is this a Tele or Face-to-Face Assessment?: Tele Assessment Is this an Initial Assessment or a Re-assessment for this encounter?: Initial Assessment Marital status: Married Is patient pregnant?: No Pregnancy Status: No Living Arrangements: Spouse/significant other, Children Can pt return to current living arrangement?: Yes Admission Status: Voluntary Is patient capable of signing voluntary admission?: Yes Referral Source: Self/Family/Friend Insurance type: Medical sales representative     Crisis Care Plan Living Arrangements: Spouse/significant other, Children  Name of Therapist: Meredith Staggers  Education Status Is patient currently in school?: No Highest grade of school patient has completed: Associates degree  Risk to self with the past 6 months Suicidal Ideation: Yes-Currently Present Has patient been a risk to self within the past 6 months prior to admission? :  Yes Suicidal Intent: Yes-Currently Present Has patient had any suicidal intent within the past 6 months prior to admission? : Yes Is patient at risk for suicide?: Yes Suicidal Plan?: Yes-Currently Present Has patient had any suicidal plan within the past 6 months prior to admission? : Yes Specify Current Suicidal Plan: pt reports he has a plan to have someone else kill him by possibly staging a robbery  Access to Means: Yes Specify Access to Suicidal Means: pt has access to individuals who can stage a robbery What has been your use of drugs/alcohol within the last 12 months?: denies Previous Attempts/Gestures: No How many times?: 0 Other Self Harm Risks: none presented Triggers for Past Attempts: Unknown (pt reports he does not know where these feelings come from) Intentional Self Injurious Behavior: None Family Suicide History: Yes (pt reports his sister attempted suicide) Recent stressful life event(s): Other (Comment) (pt reports "just stress") Persecutory voices/beliefs?: No Depression: Yes Depression Symptoms: Despondent, Insomnia, Tearfulness, Isolating, Guilt, Loss of interest in usual pleasures, Fatigue, Feeling worthless/self pity Substance abuse history and/or treatment for substance abuse?: No Suicide prevention information given to non-admitted patients: Not applicable  Risk to Others within the past 6 months Homicidal Ideation: No Does patient have any lifetime risk of violence toward others beyond the six months prior to admission? : No Thoughts of Harm to Others: No Current Homicidal Intent: No Current Homicidal Plan: No Access to Homicidal Means: No History of harm to others?: No Assessment of Violence: None Noted Does patient have access to weapons?: No Criminal Charges Pending?: No Does patient have a court date: No Is patient on probation?: No  Psychosis Hallucinations: None noted Delusions: None noted  Mental Status Report Appearance/Hygiene: In  scrubs Eye Contact: Good Motor Activity: Freedom of movement Speech: Logical/coherent, Soft (asked pt to repeat some due to inability to hear clearly ) Level of Consciousness: Alert Mood: Depressed, Anxious, Despair, Helpless, Sad Affect: Anxious, Depressed, Sad Anxiety Level: Moderate Thought Processes: Coherent, Relevant Judgement: Unimpaired Orientation: Person, Place, Time, Situation Obsessive Compulsive Thoughts/Behaviors: None  Cognitive Functioning Concentration: Normal Memory: Recent Intact, Remote Intact IQ: Average Insight: Fair Impulse Control: Fair Appetite: Poor Sleep: Decreased Total Hours of Sleep: 4 Vegetative Symptoms: Staying in bed, Not bathing, Decreased grooming  ADLScreening [facility] Assessment Services) Patient's cognitive ability adequate to safely complete daily activities?: Yes Patient able to express need for assistance with ADLs?: Yes Independently performs ADLs?: Yes (appropriate for developmental age)  Prior Inpatient Therapy Prior Inpatient Therapy: Yes Prior Therapy Dates: 2016 Prior Therapy Facilty/Provider(s): [facility] Reason for Treatment: Depression  Prior Outpatient Therapy Prior Outpatient Therapy: Yes Prior Therapy Dates: current Prior Therapy Facilty/Provider(s): Meredith Staggers, MD Reason for Treatment: Depression Does patient have an ACCT team?: No Does patient have Intensive In-House Services?  : No Does patient have [facility] services? : No Does patient have P4CC services?: No  ADL Screening (condition at time of admission) Patient's cognitive ability adequate to safely complete daily activities?: Yes Is the patient deaf or have difficulty hearing?: No Does the patient have difficulty seeing, even when wearing glasses/contacts?: No Does the patient have difficulty concentrating, remembering, or making decisions?: No Patient able to express need for  assistance with ADLs?: Yes Does the patient have difficulty dressing or  bathing?: No Independently performs ADLs?: Yes (appropriate for developmental age) Does the patient have difficulty walking or climbing stairs?: No Weakness of Legs: None Weakness of Arms/Hands: None  Home Assistive Devices/Equipment Home Assistive Devices/Equipment: None    Abuse/Neglect Assessment (Assessment to be complete while patient is alone) Physical Abuse: Denies Verbal Abuse: Denies Sexual Abuse: Denies Exploitation of patient/patient's resources: Denies Self-Neglect: Yes, present (Comment) (pt reports he has been neglecting his personal hygiene due to depression)     Advance Directives (For Healthcare) Does patient have an advance directive?: No Would patient like information on creating an advanced directive?:  Horticulturist, commercial(McKenzie Conn, RN informed that pt would like information for creating an advanced directive )    Additional Information 1:1 In Past 12 Months?: No CIRT Risk: No Elopement Risk: No Does patient have medical clearance?: Yes     Disposition: Per Alberteen SamFran Hobson, NP pt meets inpatient criteria.  Disposition Initial Assessment Completed for this Encounter: Yes Disposition of Patient: Inpatient treatment program Type of inpatient treatment program: Adult  Karolee Ohsquicha R Duff 08/05/2016 8:10 PM

## 2016-08-05 NOTE — BH Assessment (Signed)
Pt accepted to bed 401-1 per Binnie RailJoAnn Glover, RN . McKenzie Romeo Appleonn, RN has been notified. Pt is to sign the voluntary consent.  Princess BruinsAquicha Duff, MSW, LCSW-A

## 2016-08-05 NOTE — ED Notes (Signed)
Patient changed into scrubs, wanded by security and staffing office notified of sitter. 

## 2016-08-05 NOTE — BH Assessment (Signed)
Spoke with Darlys GalesMcKenzie Conn, RN and Shaune Pollackameron Isaacs, MD to inform them pt meets inpatient criteria per Alberteen SamFran Hobson, NP. Updated vitals needed to ensure stability [facility].  Princess BruinsAquicha Duff, MSW, Theresia MajorsLCSWA

## 2016-08-05 NOTE — ED Provider Notes (Signed)
[facility]-EMERGENCY DEPT Provider Note   CSN: [id number] Arrival date & time: 08/05/16  1603     History   Chief Complaint Chief Complaint  Patient presents with  . Suicidal    HPI Johnathan Steele is a 40 y.o. male.  HPI 40 year old male with past medical history of obesity diabetes and chronic depression who presents with worsening depression and suicidal ideations. Patient states that his outpatient psychiatric medications have been tapered over the last several weeks due to increasing blood pressure. Over the same time he endorses grossly worsening dysphoric mood, onset of helplessness, loss of interest, and loss of appetite. Over the last several days he has begun to have suicidal ideation. He has began to even think of pPlans including a staged home invasion so that someone else would kill him. He does have some protective factors and his children, but is concerned that he is going to hurt himself. Denies any actual suicide attempt. He subsequently presents for evaluation.  Past Medical History:  Diagnosis Date  . Depression   . Diabetes mellitus   . Hypertension   . Migraine   . Migraine with aura 09/08/2013  . Migraines   . OSA (obstructive sleep apnea) 09/08/2013    Patient Active Problem List   Diagnosis Date Noted  . Migraine with aura 09/08/2013  . OSA (obstructive sleep apnea) 09/08/2013    Past Surgical History:  Procedure Laterality Date  . SHOULDER SURGERY    . SKIN BIOPSY         Home Medications    Prior to Admission medications   Medication Sig Start Date End Date Taking? Authorizing Provider  buPROPion (WELLBUTRIN XL) 150 MG 24 hr tablet Take 150 mg by mouth daily.   Yes Historical Provider, MD  Cariprazine HCl (VRAYLAR) 1.5 MG CAPS Take 3 mg by mouth daily.   Yes Historical Provider, MD  losartan (COZAAR) 100 MG tablet Take 100 mg by mouth daily.  05/24/16  Yes Historical Provider, MD  metFORMIN (GLUCOPHAGE) 1000 MG tablet Take 1,000 mg by mouth 2  (two) times daily with a meal.   Yes Historical Provider, MD  metoprolol succinate (TOPROL-XL) 100 MG 24 hr tablet Take 100 mg by mouth every 12 (twelve) hours.  05/24/16  Yes Historical Provider, MD  modafinil (PROVIGIL) 200 MG tablet Take 200 mg by mouth daily at 6 PM. 06/12/16  Yes Historical Provider, MD  sildenafil (REVATIO) 20 MG tablet Take 20 mg by mouth daily as needed (erectile dysfunction).   Yes Historical Provider, MD  venlafaxine XR (EFFEXOR-XR) 150 MG 24 hr capsule Take 150 mg by mouth 2 (two) times daily. 09/24/15  Yes Historical Provider, MD    Family History No family history on file.  Social History Social History  Substance Use Topics  . Smoking status: Never Smoker  . Smokeless tobacco: Never Used  . Alcohol use No     Allergies   Sumatriptan succinate and Topamax [topiramate]   Review of Systems Review of Systems  Constitutional: Negative for chills, fatigue and fever.  HENT: Negative for congestion and rhinorrhea.   Eyes: Negative for visual disturbance.  Respiratory: Negative for cough, shortness of breath and wheezing.   Cardiovascular: Negative for chest pain and leg swelling.  Gastrointestinal: Negative for abdominal pain, diarrhea, nausea and vomiting.  Genitourinary: Negative for dysuria and flank pain.  Musculoskeletal: Negative for neck pain and neck stiffness.  Skin: Negative for rash and wound.  Allergic/Immunologic: Negative for immunocompromised state.  Neurological: Negative for  syncope, weakness and headaches.  Psychiatric/Behavioral: Positive for dysphoric mood and suicidal ideas.  All other systems reviewed and are negative.    Physical Exam Updated Vital Signs BP 140/90 (BP Location: Right Arm)   Pulse 78   Temp 98.1 F (36.7 C)   Resp 18   SpO2 100%   Physical Exam  Constitutional: He is oriented to person, place, and time. He appears well-developed and well-nourished. No distress.  HENT:  Head: Normocephalic and atraumatic.    Eyes: Conjunctivae are normal.  Neck: Neck supple.  Cardiovascular: Normal rate, regular rhythm and normal heart sounds.  Exam reveals no friction rub.   No murmur heard. Pulmonary/Chest: Effort normal and breath sounds normal. No respiratory distress. He has no wheezes. He has no rales.  Abdominal: He exhibits no distension.  Musculoskeletal: He exhibits no edema.  Neurological: He is alert and oriented to person, place, and time. He exhibits normal muscle tone.  Skin: Skin is warm. Capillary refill takes less than 2 seconds.  Psychiatric: He has a normal mood and affect. He expresses suicidal ideation. He expresses suicidal plans.  Nursing note and vitals reviewed.    ED Treatments / Results  Labs (all labs ordered are listed, but only abnormal results are displayed) Labs Reviewed  COMPREHENSIVE METABOLIC PANEL - Abnormal; Notable for the following:       Result Value   Glucose, Bld 232 (*)    AST 109 (*)    ALT 135 (*)    All other components within normal limits  ACETAMINOPHEN LEVEL - Abnormal; Notable for the following:    Acetaminophen (Tylenol), Serum <10 (*)    All other components within normal limits  CBC - Abnormal; Notable for the following:    WBC 10.8 (*)    All other components within normal limits  CBG MONITORING, ED - Abnormal; Notable for the following:    Glucose-Capillary 210 (*)    All other components within normal limits  ETHANOL  SALICYLATE LEVEL  URINE RAPID DRUG SCREEN, HOSP PERFORMED    EKG  EKG Interpretation None       Radiology No results found.  Procedures Procedures (including critical care time)  Medications Ordered in ED Medications - No data to display   Initial Impression / Assessment and Plan / ED Course  I have reviewed the triage vital signs and the nursing notes.  Pertinent labs & imaging results that were available during my care of the patient were reviewed by me and considered in my medical decision making (see  chart for details).  Clinical Course   40 year old male with past medical history of depression who presents with worsening depression and suicidal ideation. Patient has suicidal plan. On arrival, vital signs are stable and within normal limits. He is well-appearing on my exam. He is not intoxicated. Neurological exam is nonfocal. Screening lab work reviewed. Patient does have mild elevation of AST and ALT. He will need outpatient follow-up for this. I suspect this is secondary to fatty liver disease. Given body habitus. No hepatitis risk factors. No right upper quadrant tenderness to suggest cholecystitis. Otherwise, patient is medically stable. Given his persistent depression and suicidal ideation, he does meet emergent psych consultation criteria and will consult psychiatry for evaluation.  TTS has evaluated and accepted to behavioral health. Patient remains hemodynamically stable. He is well-appearing on my exam prior to transfer. Medically stable for admission.  Final Clinical Impressions(s) / ED Diagnoses   Final diagnoses:  Suicidal thoughts  Depression  Transaminitis    New Prescriptions New Prescriptions   No medications on file     Shaune Pollack, MD 08/05/16 2320

## 2016-08-05 NOTE — ED Notes (Signed)
Johnathan Steele, brother, contact info [phone]

## 2016-08-05 NOTE — ED Triage Notes (Signed)
Patient here with suicidal ideation x 2-3 days with hx of depression. Has been taking his daily meds as prescribed. Alert and oriented, calm and cooperative on assessment.  Denies etoh and drug use

## 2016-08-06 ENCOUNTER — Inpatient Hospital Stay (HOSPITAL_COMMUNITY)
Admission: AD | Admit: 2016-08-06 | Discharge: 2016-08-10 | DRG: 885 | Disposition: A | Discharge status: Home / Self Care | Payer: Managed Care, Other (non HMO) | Source: Intra-hospital | Attending: Psychiatry | Admitting: Psychiatry

## 2016-08-06 ENCOUNTER — Encounter (HOSPITAL_COMMUNITY): Payer: Self-pay | Admitting: *Deleted

## 2016-08-06 DIAGNOSIS — Z7984 Long term (current) use of oral hypoglycemic drugs: Secondary | ICD-10-CM

## 2016-08-06 DIAGNOSIS — I10 Essential (primary) hypertension: Secondary | ICD-10-CM | POA: Diagnosis present

## 2016-08-06 DIAGNOSIS — R45851 Suicidal ideations: Secondary | ICD-10-CM | POA: Diagnosis present

## 2016-08-06 DIAGNOSIS — G4733 Obstructive sleep apnea (adult) (pediatric): Secondary | ICD-10-CM | POA: Diagnosis present

## 2016-08-06 DIAGNOSIS — F332 Major depressive disorder, recurrent severe without psychotic features: Principal | ICD-10-CM

## 2016-08-06 DIAGNOSIS — G4700 Insomnia, unspecified: Secondary | ICD-10-CM | POA: Diagnosis present

## 2016-08-06 DIAGNOSIS — E119 Type 2 diabetes mellitus without complications: Secondary | ICD-10-CM | POA: Diagnosis present

## 2016-08-06 LAB — LIPID PANEL
Cholesterol: 242 mg/dL — ABNORMAL HIGH (ref 0–200)
HDL: 42 mg/dL (ref >40)
LDL Cholesterol: 131 mg/dL — ABNORMAL HIGH (ref 0–99)
Total CHOL/HDL Ratio: 5.8 RATIO
Triglycerides: 346 mg/dL — ABNORMAL HIGH (ref <150)
VLDL: 69 mg/dL — ABNORMAL HIGH (ref 0–40)

## 2016-08-06 LAB — GLUCOSE, CAPILLARY
Glucose-Capillary: 300 mg/dL — ABNORMAL HIGH (ref 65–99)
Glucose-Capillary: 212 mg/dL — ABNORMAL HIGH (ref 65–99)

## 2016-08-06 MED ORDER — CARIPRAZINE HCL 1.5 MG PO CAPS: 3.0000 mg | ORAL_CAPSULE | Freq: Every day | ORAL | Status: DC

## 2016-08-06 MED ORDER — BUPROPION HCL ER (XL) 150 MG PO TB24
150.0000 mg | ORAL_TABLET | Freq: Every day | ORAL | Status: DC
  Administered 2016-08-06 – 2016-08-10 (×5): 150 mg via ORAL
  Filled 2016-08-06 (×7): qty 1

## 2016-08-06 MED ORDER — CARIPRAZINE HCL 1.5 MG PO CAPS
3.0000 mg | ORAL_CAPSULE | Freq: Every day | ORAL | Status: DC
  Administered 2016-08-06 – 2016-08-10 (×5): 3 mg via ORAL
  Filled 2016-08-06 (×7): qty 2

## 2016-08-06 MED ORDER — METOPROLOL SUCCINATE ER 100 MG PO TB24
100.0000 mg | ORAL_TABLET | Freq: Two times a day (BID) | ORAL | Status: DC
  Administered 2016-08-06 – 2016-08-10 (×9): 100 mg via ORAL
  Filled 2016-08-06 (×13): qty 1

## 2016-08-06 MED ORDER — LOSARTAN POTASSIUM 50 MG PO TABS
100.0000 mg | ORAL_TABLET | Freq: Every day | ORAL | Status: DC
  Administered 2016-08-06 – 2016-08-10 (×5): 100 mg via ORAL
  Filled 2016-08-06 (×7): qty 2

## 2016-08-06 MED ORDER — ACETAMINOPHEN 325 MG PO TABS: 650.0000 mg | ORAL_TABLET | Freq: Four times a day (QID) | ORAL | Status: DC | PRN

## 2016-08-06 MED ORDER — TRAZODONE HCL 50 MG PO TABS
50.0000 mg | ORAL_TABLET | Freq: Every evening | ORAL | Status: DC | PRN
  Administered 2016-08-06: 50 mg via ORAL
  Filled 2016-08-06 (×6): qty 1

## 2016-08-06 MED ORDER — METFORMIN HCL 500 MG PO TABS
1000.0000 mg | ORAL_TABLET | Freq: Two times a day (BID) | ORAL | Status: DC
  Administered 2016-08-06 – 2016-08-10 (×9): 1000 mg via ORAL
  Filled 2016-08-06 (×13): qty 2

## 2016-08-06 MED ORDER — FLUOXETINE HCL 10 MG PO CAPS
10.0000 mg | ORAL_CAPSULE | Freq: Every day | ORAL | Status: DC
  Administered 2016-08-06 – 2016-08-07 (×2): 10 mg via ORAL
  Filled 2016-08-06 (×4): qty 1

## 2016-08-06 MED ORDER — VENLAFAXINE HCL ER 150 MG PO CP24
150.0000 mg | ORAL_CAPSULE | Freq: Two times a day (BID) | ORAL | Status: DC
  Administered 2016-08-06: 150 mg via ORAL
  Filled 2016-08-06 (×5): qty 1

## 2016-08-06 NOTE — Progress Notes (Signed)
Patient ID: Johnathan EvenerChristopher A Ramnauth, male   DOB: 08/06/1976, 40 y.o.   MRN: [id number]  Writer notified NP Tanika L. About patient's CBG 300. Order for HgbA1C given for evening blood draw.

## 2016-08-06 NOTE — Progress Notes (Signed)
Adult Psychoeducational Group Note  Date:  08/06/2016 Time:  11:19 AM  Group Topic/Focus:  Goals Group:   The focus of this group is to help patients establish daily goals to achieve during treatment and discuss how the patient can incorporate goal setting into their daily lives to aide in recovery.   Participation Level:  Active  Participation Quality:  Appropriate  Affect:  Appropriate  Cognitive:  Alert and Appropriate  Insight: Appropriate and Good  Engagement in Group:  Engaged  Modes of Intervention:  Discussion  Additional Comments:  Pt participated in group this morning. Pt explained that he has a problem opening up to people and tends to keep everything inside.  Pt goal is to use coping skills to help with the stress from family and his job.  [facility] he had a plan to end his life but that is not the case at this time. Caren R Crotts 08/06/2016, 11:19 AM

## 2016-08-06 NOTE — ED Notes (Signed)
Called [facility] for transport. 

## 2016-08-06 NOTE — Tx Team (Signed)
Initial Treatment Plan 08/06/2016 1:12 AM Johnathan Evenerhristopher A Huster ZOX:[id number]    PATIENT STRESSORS: Health problems Marital or family conflict   PATIENT STRENGTHS: Ability for insight Average or above average intelligence Capable of independent living General fund of knowledge Motivation for treatment/growth   PATIENT IDENTIFIED PROBLEMS: Depression  Suicidal Thoughts  "coping skills, dealing with life stressors"                 DISCHARGE CRITERIA:  Ability to meet basic life and health needs Improved stabilization in mood, thinking, and/or behavior Verbal commitment to aftercare and medication compliance  PRELIMINARY DISCHARGE PLAN: Attend aftercare/continuing care group Return to previous living arrangement  PATIENT/FAMILY INVOLVEMENT: This treatment plan has been presented to and reviewed with the patient, Johnathan Steele, and/or family member, .  The patient and family have been given the opportunity to ask questions and make suggestions.  McNichol, [place], [place]RN 08/06/2016, 1:12 AM

## 2016-08-06 NOTE — Progress Notes (Signed)
Recreation Therapy Notes  Date: 08/06/16 Time: 0930 Location: 300 Hall Group Room  Group Topic: Stress Management  Goal Area(s) Addresses:  Patient will verbalize importance of using healthy stress management.  Patient will identify positive emotions associated with healthy stress management.   Behavioral Response: Engaged  Intervention: Stress Management  Activity :  Guided Imagery.  LRT introduced the stress management technique of guided imagery to the patients.  LRT read a script so patients could participate in the activity.  Patients were to follow along as LRT read script to participate.  Education:  Stress Management, Discharge Planning.   Education Outcome: Acknowledges edcuation/In group clarification offered/Needs additional education  Clinical Observations/Feedback: Pt attended group.   Marjette Lindsay, LRT/CTRS         Lindsay, Marjette A 08/06/2016 12:16 PM 

## 2016-08-06 NOTE — Progress Notes (Signed)
Patient ID: Johnathan EvenerChristopher A Petsch, male   DOB: 06/13/1976, 40 y.o.   MRN: [id number]  EKG performed with no distress, patients respirations even and unlabored. NP Alcario Droughtanika was notified EKG was done and shown physical printouts of EKG. These were placed on patient's physical chart.

## 2016-08-06 NOTE — Progress Notes (Signed)
Patient ID: Johnathan Steele, male   DOB: 08/29/1976, 40 y.o.   MRN: [id number]  DAR: Pt. Denies SI/HI and A/V Hallucinations. He reports sleep is fair, appetite is fair, energy level is low, and concentration is good. He rates depression, hopelessness, and anxiety 7/10. Patient does not report any pain or discomfort at this time. Support and encouragement provided to the patient. Scheduled medications administered to patient per physician's order. Patient is minimal with staff but cooperative. He tolerated EKG well. No behavioral incidents noted. Q15 minute checks are maintained for safety.

## 2016-08-06 NOTE — ED Notes (Signed)
[facility]. Pt ambulated independently.

## 2016-08-06 NOTE — Progress Notes (Signed)
40 year old male pt admitted on voluntary basis. Johnathan Steele presents as depressed and spoke about how he has been feeling suicidal and does have passive SI on admission but able to contract for safety in the hospital. He spoke about how he is stressed and unable to handle the stress and spoke about how he needed to come in to help with coping skills. Johnathan Steele reports that he has been taking all his medications as prescribed. He did not verbalize any complaints of pain on admission, he was oriented to the unit and safety maintained.

## 2016-08-06 NOTE — ED Notes (Signed)
All personal belongings sent with [facility].

## 2016-08-06 NOTE — [facility] Suicide Risk Assessment (Signed)
[facility]   Nursing information obtained from:    Demographic factors:    Current Mental Status:    Loss Factors:    Historical Factors:    Risk Reduction Factors:     Total Time spent with patient: 30 minutes Principal Problem: MDD (major depressive disorder), recurrent severe, without psychosis (HCC) Diagnosis:   Patient Active Problem List   Diagnosis Date Noted  . MDD (major depressive disorder), recurrent severe, without psychosis (HCC) [F33.2] 08/06/2016  . Migraine with aura [G43.109] 09/08/2013  . OSA (obstructive sleep apnea) [G47.33] 09/08/2013   Subjective Data: Patient states worsening depressive sx- had recent medication changes recently - did not tolerate it well- has been on wellbutrin and effexor - for long time. Would like to change his effexor to another AD - Tanika NP to discuss other medication options with him.    Continued Clinical Symptoms:  Alcohol Use Disorder Identification Test Final Score (AUDIT): 1 The "Alcohol Use Disorders Identification Test", Guidelines for Use in Primary Care, Second Edition.  World Science writerHealth Organization [facility](WHO). Score between 0-7:  no or low risk or alcohol related problems. Score between 8-15:  moderate risk of alcohol related problems. Score between 16-19:  high risk of alcohol related problems. Score 20 or above:  warrants further diagnostic evaluation for alcohol dependence and treatment.   CLINICAL FACTORS:   Depression:   Hopelessness Impulsivity Insomnia   Musculoskeletal: Strength & Muscle Tone: within normal limits Gait & Station: normal Patient leans: N/A  Psychiatric Specialty Exam: Physical Exam  Review of Systems  Psychiatric/Behavioral: Positive for depression. The patient is nervous/anxious and has insomnia.   All other systems reviewed and are negative.   Blood pressure 124/84, pulse 84, temperature 98.7 F (37.1 C), temperature source Oral, resp. rate 18, height 6\' 1"  (1.854  m), weight (!) 146.1 kg (322 lb).Body mass index is 42.48 kg/m.  General Appearance: Guarded  Eye Contact:  Fair  Speech:  Normal Rate  Volume:  Normal  Mood:  Depressed  Affect:  Appropriate  Thought Process:  Goal Directed and Descriptions of Associations: Intact  Orientation:  Full (Time, Place, and Person)  Thought Content:  Rumination  Suicidal Thoughts:  Yes.  with intent/plan  Homicidal Thoughts:  No  Memory:  Immediate;   Fair Recent;   Fair Remote;   Fair  Judgement:  Impaired  Insight:  Shallow  Psychomotor Activity:  Normal  Concentration:  Concentration: Fair and Attention Span: Fair  Recall:  [employer]Fair  Fund of Knowledge:  Fair  Language:  Fair  Akathisia:  No  Handed:  Right  AIMS (if indicated):     Assets:  Desire for Improvement  ADL's:  Intact  Cognition:  WNL  Sleep:  Number of Hours: 4 (late admission)      COGNITIVE FEATURES THAT CONTRIBUTE TO RISK:  Closed-mindedness, Polarized thinking and Thought constriction (tunnel vision)    SUICIDE RISK:   Moderate:  Frequent suicidal ideation with limited intensity, and duration, some specificity in terms of plans, no associated intent, good self-control, limited dysphoria/symptomatology, some risk factors present, and identifiable protective factors, including available and accessible social support.   PLAN OF CARE: Will readjust medications - plan to DC effexor and cariprazine- start another AD - SSRI and continue wellbutrin. Will also consider adding geodon to augment the effect of his AD. Please see H&P per Alcario Droughtanika NP.  I certify that inpatient services furnished can reasonably be expected to improve the patient's condition.  Eappen,Saramma,  MD 08/06/2016, 12:10 PM

## 2016-08-06 NOTE — H&P (Signed)
Psychiatric Admission Assessment Adult  Patient Identification: Johnathan Steele MRN:  [id number] Date of Evaluation:  08/06/2016 Chief Complaint:  mdd,rec,sev Principal Diagnosis: MDD (major depressive disorder), recurrent severe, without psychosis (HCC) Diagnosis:   Patient Active Problem List   Diagnosis Date Noted  . MDD (major depressive disorder), recurrent severe, without psychosis (HCC) [F33.2] 08/06/2016  . Migraine with aura [G43.109] 09/08/2013  . OSA (obstructive sleep apnea) [G47.33] 09/08/2013   History of Present Illness:  Pt endorses suicidal plan and states he thought about staging a robbery and having someone kill him. Pt states this scared him because although he has thought about suicide in the past he has never actually thought of a way to do it so he immediately came to the hospital. Pt denies any SA and reports his triggers are "just stress." Pt reports a desire to stay in bed all day and reports he finds it difficult to find the energy to do anything including tending to personal hygiene, eating, and spending time with his family. Pt endorses multiple depressive symptoms including isolating, insomnia, restlessness, poor appetite, hopelessness, crying spells, and feeling anxious. Pt reports he worries about everything and when his anxiety does not subside it leads to horrible migraines. Pt reports he has been hospitalized in 2016 in [facility] due to feeling depressed and suicidal and states he currently is seeing a therapist for the same reason. When asked about stressors, pt reports "anything could trigger it, just stuff at home." Pt reports his mood is "really really sad for most of the day." Pt denies any past history of abuse or trauma and states he began feeling this way about a week ago with no significant casual factor. Pt denies A/V hallucinations and denies H/I. Pt also denies past history of trauma or abuse and states he has a strong support system at home including  his wife and kids.   On Evaluation: Johnathan Steele is awake, alert and oriented *4, seen standing in the hall.  Denies suicidal or homicidal ideation at this time.Denies auditory or visual hallucination and does not appear to be responding to internal stimuli. Patient validates the information provided in the HPI. Support, encouragement and reassurance was provided.    Associated Signs/Symptoms: Depression Symptoms:  depressed mood, insomnia, fatigue, feelings of worthlessness/guilt, difficulty concentrating, anxiety, loss of energy/fatigue, (Hypo) Manic Symptoms:  Distractibility, Anxiety Symptoms:  Excessive Worry, Social Anxiety, Psychotic Symptoms:  Hallucinations: None PTSD Symptoms: Avoidance:  Decreased Interest/Participation Foreshortened Future Total Time spent with patient: 30 minutes  Past Psychiatric History: See Above  Is the patient at risk to self? Yes.    Has the patient been a risk to self in the past 6 months? Yes.    Has the patient been a risk to self within the distant past? Yes.    Is the patient a risk to others? No.  Has the patient been a risk to others in the past 6 months? No.  Has the patient been a risk to others within the distant past? No.   Prior Inpatient Therapy:   Prior Outpatient Therapy:    Alcohol Screening: 1. How often do you have a drink containing alcohol?: Monthly or less 2. How many drinks containing alcohol do you have on a typical day when you are drinking?: 1 or 2 3. How often do you have six or more drinks on one occasion?: Never Preliminary Score: 0 9. Have you or someone else been injured as a result of your drinking?: No  10. Has a relative or friend or a doctor or another health worker been concerned about your drinking or suggested you cut down?: No Alcohol Use Disorder Identification Test Final Score (AUDIT): 1 Brief Intervention: AUDIT score less than 7 or less-screening does not suggest unhealthy drinking-brief  intervention not indicated Substance Abuse History in the last 12 months:  No. Consequences of Substance Abuse: NA Previous Psychotropic Medications: YES Psychological Evaluations: NO Past Medical History:  Past Medical History:  Diagnosis Date  . Depression   . Diabetes mellitus   . Hypertension   . Migraine   . Migraine with aura 09/08/2013  . Migraines   . OSA (obstructive sleep apnea) 09/08/2013    Past Surgical History:  Procedure Laterality Date  . SHOULDER SURGERY    . SKIN BIOPSY     Family History: History reviewed. No pertinent family history. Family Psychiatric  History: Sister (31) diseased Bipolar, depression Tobacco Screening: Have you used any form of tobacco in the last 30 days? (Cigarettes, Smokeless Tobacco, Cigars, and/or Pipes): No Social History:  History  Alcohol Use No     History  Drug Use No    Additional Social History:                           Allergies:   Allergies  Allergen Reactions  . Sumatriptan Succinate Other (See Comments)    Hypertension  . Topamax [Topiramate] Other (See Comments)    Cognitive skills deteriorate.  Reflex's slow down markedly.   Lab Results:  Results for orders placed or performed during the hospital encounter of 08/06/16 (from the past 48 hour(s))  Lipid panel     Status: Abnormal   Collection Time: 08/06/16  6:17 AM  Result Value Ref Range   Cholesterol 242 (H) 0 - 200 mg/dL   Triglycerides 811 (H) <150 mg/dL   HDL 42 >91 mg/dL   Total CHOL/HDL Ratio 5.8 RATIO   VLDL 69 (H) 0 - 40 mg/dL   LDL Cholesterol 478 (H) 0 - 99 mg/dL    Comment:        Total Cholesterol/HDL:CHD Risk Coronary Heart Disease Risk Table                     Men   Women  1/2 Average Risk   3.4   3.3  Average Risk       5.0   4.4  2 X Average Risk   9.6   7.1  3 X Average Risk  23.4   11.0        Use the calculated Patient Ratio above and the CHD Risk Table to determine the patient's CHD Risk.        ATP III  CLASSIFICATION (LDL):  <100     mg/dL   Optimal  295-621  mg/dL   Near or Above                    Optimal  130-159  mg/dL   Borderline  308-657  mg/dL   High  >846     mg/dL   Very High Performed at [facility]   Glucose, capillary     Status: Abnormal   Collection Time: 08/06/16  8:45 AM  Result Value Ref Range   Glucose-Capillary 300 (H) 65 - 99 mg/dL   Comment 1 Notify RN    Comment 2 Document in Chart     Blood Alcohol  level:  Lab Results  Component Value Date   ETH <5 08/05/2016    Metabolic Disorder Labs:  No results found for: HGBA1C, MPG No results found for: PROLACTIN Lab Results  Component Value Date   CHOL 242 (H) 08/06/2016   TRIG 346 (H) 08/06/2016   HDL 42 08/06/2016   CHOLHDL 5.8 08/06/2016   VLDL 69 (H) 08/06/2016   LDLCALC 131 (H) 08/06/2016    Current Medications: Current Facility-Administered Medications  Medication Dose Route Frequency Provider Last Rate Last Dose  . acetaminophen (TYLENOL) tablet 650 mg  650 mg Oral Q6H PRN Kristeen MansFran E Hobson, NP      . buPROPion (WELLBUTRIN XL) 24 hr tablet 150 mg  150 mg Oral Daily Kristeen MansFran E Hobson, NP   150 mg at 08/06/16 0851  . Cariprazine HCl CAPS 3 mg  3 mg Oral Daily Rockey SituFernando A Cobos, MD      . losartan (COZAAR) tablet 100 mg  100 mg Oral Daily Kristeen MansFran E Hobson, NP   100 mg at 08/06/16 0851  . metFORMIN (GLUCOPHAGE) tablet 1,000 mg  1,000 mg Oral BID WC Kristeen MansFran E Hobson, NP   1,000 mg at 08/06/16 0851  . metoprolol succinate (TOPROL-XL) 24 hr tablet 100 mg  100 mg Oral Q12H Kristeen MansFran E Hobson, NP   100 mg at 08/06/16 0850  . venlafaxine XR (EFFEXOR-XR) 24 hr capsule 150 mg  150 mg Oral BID Kristeen MansFran E Hobson, NP   150 mg at 08/06/16 [id number]   PTA Medications: Prescriptions Prior to Admission  Medication Sig Dispense Refill Last Dose  . buPROPion (WELLBUTRIN XL) 150 MG 24 hr tablet Take 150 mg by mouth daily.   08/05/2016 at Unknown time  . Cariprazine HCl (VRAYLAR) 1.5 MG CAPS Take 3 mg by mouth daily.   08/05/2016 at Unknown  time  . losartan (COZAAR) 100 MG tablet Take 100 mg by mouth daily.    08/05/2016 at Unknown time  . metFORMIN (GLUCOPHAGE) 1000 MG tablet Take 1,000 mg by mouth 2 (two) times daily with a meal.   08/05/2016 at am  . metoprolol succinate (TOPROL-XL) 100 MG 24 hr tablet Take 100 mg by mouth every 12 (twelve) hours.    08/04/2016 at 1900  . modafinil (PROVIGIL) 200 MG tablet Take 200 mg by mouth daily at 6 PM.   08/04/2016 at Unknown time  . sildenafil (REVATIO) 20 MG tablet Take 20 mg by mouth daily as needed (erectile dysfunction).   couple months ago  . venlafaxine XR (EFFEXOR-XR) 150 MG 24 hr capsule Take 150 mg by mouth 2 (two) times daily.   08/05/2016 at am    Musculoskeletal: Strength & Muscle Tone: within normal limits Gait & Station: normal Patient leans: N/A  Psychiatric Specialty Exam: Physical Exam  Nursing note and vitals reviewed. Constitutional: He is oriented to person, place, and time. He appears well-developed.  HENT:  Head: Normocephalic.  Musculoskeletal: Normal range of motion.  Neurological: He is alert and oriented to person, place, and time.  Psychiatric: He has a normal mood and affect. His behavior is normal.    Review of Systems  Psychiatric/Behavioral: Positive for depression and suicidal ideas. Negative for hallucinations. The patient is nervous/anxious and has insomnia.     Blood pressure (!) 138/95, pulse 100, temperature 98.7 F (37.1 C), temperature source Oral, resp. rate 18, height 6\' 1"  (1.854 m), weight (!) 146.1 kg (322 lb).Body mass index is 42.48 kg/m.  General Appearance: Casual paper scrubs  Eye Contact:  Fair  Speech:  Clear and Coherent  Volume:  Decreased  Mood:  Depressed  Affect:  Appropriate and Blunt  Thought Process:  Coherent  Orientation:  Full (Time, Place, and Person)  Thought Content:  Hallucinations: None  Suicidal Thoughts:  Yes.  without intent/plan  Homicidal Thoughts:  No  Memory:  Immediate;   Fair Recent;   Fair Remote;    Fair  Judgement:  Fair  Insight:  Present  Psychomotor Activity:  Restlessness  Concentration:  Concentration: Fair  Recall:  [employer] of Knowledge:  Fair  Language:  Poor  Akathisia:  Negative  Handed:  Right  AIMS (if indicated):     Assets:  Communication Skills Desire for Improvement Resilience Social Support  ADL's:  Intact  Cognition:  WNL  Sleep:  Number of Hours: 4 (late admission)    I agree with current treatment plan on 08/06/2016, Patient seen face-to-face for psychiatric evaluation follow-up, chart reviewed and discussed with MD Eappen. Reviewed the information documented and agree with the treatment plan.  Treatment Plan Summary: Daily contact with patient to assess and evaluate symptoms and progress in treatment and Medication management   Continue with Wellbutrin 150 mg for depression Discontinue Effexor 150 mg  for mood stabilization. Start Prozac 10 mg form mood stabilization  (with titration) Continue Cariprazine HCL 3mg  for mood/depression Continue with Trazodone 50 mg with 1 repeat for insomnia Will continue to monitor vitals ,medication compliance and treatment side effects while patient is here.  Reviewed labs Glucose 232 elevated ,BAL -,  UDS -  Elevated AST/ALT (patient reports dx of fatty liver) denies EToH -collected A1C, TSH and Prolactin and EKG9/03/2016- Results pending  CSW will start working on disposition.  Patient to participate in therapeutic milieu   Observation Level/Precautions:  15 minute checks  Laboratory:  CBC Chemistry Profile HbAIC UA  Psychotherapy:  Individual and group session  Medications:  See Above  Consultations:  Psychiatry  Discharge Concerns:  Safety, stabilization, and risk of access to medication and medication stabilization   Estimated LOS: 5-7 days  Other:     Physician Treatment Plan for Primary Diagnosis: MDD (major depressive disorder), recurrent severe, without psychosis (HCC) Long Term Goal(s):  Improvement in symptoms so as ready for discharge  Short Term Goals: Ability to identify changes in lifestyle to reduce recurrence of condition will improve, Ability to verbalize feelings will improve, Ability to identify and develop effective coping behaviors will improve and Compliance with prescribed medications will improve  Physician Treatment Plan for Secondary Diagnosis: Principal Problem:   MDD (major depressive disorder), recurrent severe, without psychosis (HCC)  Long Term Goal(s): Improvement in symptoms so as ready for discharge  Short Term Goals: Ability to identify changes in lifestyle to reduce recurrence of condition will improve, Ability to verbalize feelings will improve and Ability to maintain clinical measurements within normal limits will improve  I certify that inpatient services furnished can reasonably be expected to improve the patient's condition.    Oneta Rack, NP 9/4/20179:01 AM

## 2016-08-07 DIAGNOSIS — F332 Major depressive disorder, recurrent severe without psychotic features: Secondary | ICD-10-CM | POA: Diagnosis not present

## 2016-08-07 LAB — TSH: TSH: 0.598 u[IU]/mL (ref 0.350–4.500)

## 2016-08-07 LAB — HEMOGLOBIN A1C
Hgb A1c MFr Bld: 7.5 % — ABNORMAL HIGH (ref 4.8–5.6)
Mean Plasma Glucose: 169 mg/dL

## 2016-08-07 LAB — GLUCOSE, CAPILLARY: Glucose-Capillary: 235 mg/dL — ABNORMAL HIGH (ref 65–99)

## 2016-08-07 MED ORDER — FLUOXETINE HCL 20 MG PO CAPS
20.0000 mg | ORAL_CAPSULE | Freq: Every day | ORAL | Status: DC
  Administered 2016-08-08 – 2016-08-10 (×3): 20 mg via ORAL
  Filled 2016-08-07 (×5): qty 1

## 2016-08-07 MED ORDER — TRAZODONE HCL 50 MG PO TABS
50.0000 mg | ORAL_TABLET | Freq: Every evening | ORAL | Status: DC | PRN
  Administered 2016-08-07 – 2016-08-09 (×3): 50 mg via ORAL
  Filled 2016-08-07 (×3): qty 1

## 2016-08-07 NOTE — Progress Notes (Signed)
Johnathan Steele. Johnathan Steele had been up and visible in the milieu, minimal interaction. He has appeared depressed and withdrawn this evening and did not offer much more than yes or no to various questions. Johnathan Steele did receive medications without incident and did not verbalize any complaints of pain. A. Support and encouragement provided. R. Safety maintained, will continue to monitor.

## 2016-08-07 NOTE — Progress Notes (Signed)
    Type of Therapy:  Psychoeducational Skills  Participation Level:  Did Not Attend  Participation Quality:  DID NOT ATTEND  Affect:  DID NOT ATTEND  Cognitive:  DID NOT ATTEND  Insight:  None  Engagement in Group:  DID NOT ATTEND  Modes of Intervention:  DID NOT ATTEND  Summary of Progress/Problems: Pt did not attend    Bethann PunchesJane O Ochieng 08/07/2016, 10:10 AM

## 2016-08-07 NOTE — [facility] Group Notes (Signed)
[facility] LCSW Group Therapy 08/07/2016 1:15 PM  Type of Therapy: Group Therapy- Feelings about Diagnosis  Pt did not attend, declined invitation.   Chad CordialLauren Carter, LCSWA 08/07/2016 3:49 PM

## 2016-08-07 NOTE — Tx Team (Signed)
Interdisciplinary Treatment and Diagnostic Plan Update  08/07/2016 Time of Session: 8:36 AM  OREN BARELLA MRN: [id number]  Principal Diagnosis: MDD (major depressive disorder), recurrent severe, without psychosis (Johnathan Steele)  Secondary Diagnoses: Principal Problem:   MDD (major depressive disorder), recurrent severe, without psychosis ([place])   Current Medications:  Current Facility-Administered Medications  Medication Dose Route Frequency Provider Last Rate Last Dose  . acetaminophen (TYLENOL) tablet 650 mg  650 mg Oral Q6H PRN Lurena Nida, NP      . buPROPion (WELLBUTRIN XL) 24 hr tablet 150 mg  150 mg Oral Daily Lurena Nida, NP   150 mg at 08/07/16 0827  . Cariprazine HCl CAPS 3 mg  3 mg Oral Daily Jenne Campus, MD   3 mg at 08/07/16 0827  . FLUoxetine (PROZAC) capsule 10 mg  10 mg Oral Daily Derrill Center, NP   10 mg at 08/07/16 0827  . losartan (COZAAR) tablet 100 mg  100 mg Oral Daily Lurena Nida, NP   100 mg at 08/07/16 0827  . metFORMIN (GLUCOPHAGE) tablet 1,000 mg  1,000 mg Oral BID WC Lurena Nida, NP   1,000 mg at 08/07/16 0827  . metoprolol succinate (TOPROL-XL) 24 hr tablet 100 mg  100 mg Oral Q12H Lurena Nida, NP   100 mg at 08/06/16 2136  . traZODone (DESYREL) tablet 50 mg  50 mg Oral QHS,MR X 1 Derrill Center, NP   50 mg at 08/06/16 2136    PTA Medications: Prescriptions Prior to Admission  Medication Sig Dispense Refill Last Dose  . buPROPion (WELLBUTRIN XL) 150 MG 24 hr tablet Take 150 mg by mouth daily.   08/05/2016 at Unknown time  . Cariprazine HCl (VRAYLAR) 1.5 MG CAPS Take 3 mg by mouth daily.   08/05/2016 at Unknown time  . losartan (COZAAR) 100 MG tablet Take 100 mg by mouth daily.    08/05/2016 at Unknown time  . metFORMIN (GLUCOPHAGE) 1000 MG tablet Take 1,000 mg by mouth 2 (two) times daily with a meal.   08/05/2016 at am  . metoprolol succinate (TOPROL-XL) 100 MG 24 hr tablet Take 100 mg by mouth every 12 (twelve) hours.    08/04/2016 at 1900  .  modafinil (PROVIGIL) 200 MG tablet Take 200 mg by mouth daily at 6 PM.   08/04/2016 at Unknown time  . sildenafil (REVATIO) 20 MG tablet Take 20 mg by mouth daily as needed (erectile dysfunction).   couple months ago  . venlafaxine XR (EFFEXOR-XR) 150 MG 24 hr capsule Take 150 mg by mouth 2 (two) times daily.   08/05/2016 at am    Treatment Modalities: Medication Management, Group therapy, Case management,  1 to 1 session with clinician, Psychoeducation, Recreational therapy.   Physician Treatment Plan for Primary Diagnosis: MDD (major depressive disorder), recurrent severe, without psychosis ([place]) Long Term Goal(s): Improvement in symptoms so as ready for discharge  Short Term Goals: Ability to identify changes in lifestyle to reduce recurrence of condition will improve, Ability to verbalize feelings will improve, Ability to identify and develop effective coping behaviors will improve and Compliance with prescribed medications will improve  Medication Management: Evaluate patient's response, side effects, and tolerance of medication regimen.  Therapeutic Interventions: 1 to 1 sessions, Unit Group sessions and Medication administration.  Evaluation of Outcomes: Not Met  Physician Treatment Plan for Secondary Diagnosis: Principal Problem:   MDD (major depressive disorder), recurrent severe, without psychosis ([place])   Long Term Goal(s): Improvement in  symptoms so as ready for discharge  Short Term Goals: Ability to identify changes in lifestyle to reduce recurrence of condition will improve, Ability to verbalize feelings will improve and Ability to maintain clinical measurements within normal limits will improve  Medication Management: Evaluate patient's response, side effects, and tolerance of medication regimen.  Therapeutic Interventions: 1 to 1 sessions, Unit Group sessions and Medication administration.  Evaluation of Outcomes: Not Met   RN Treatment Plan for Primary Diagnosis: MDD  (major depressive disorder), recurrent severe, without psychosis ([place]) Long Term Goal(s): Knowledge of disease and therapeutic regimen to maintain health will improve  Short Term Goals: Ability to demonstrate self-control, Ability to disclose and discuss suicidal ideas and Ability to identify and develop effective coping behaviors will improve  Medication Management: RN will administer medications as ordered by provider, will assess and evaluate patient's response and provide education to patient for prescribed medication. RN will report any adverse and/or side effects to prescribing provider.  Therapeutic Interventions: 1 on 1 counseling sessions, Psychoeducation, Medication administration, Evaluate responses to treatment, Monitor vital signs and CBGs as ordered, Perform/monitor CIWA, COWS, AIMS and Fall Risk screenings as ordered, Perform wound care treatments as ordered.  Evaluation of Outcomes: Not Met   LCSW Treatment Plan for Primary Diagnosis: MDD (major depressive disorder), recurrent severe, without psychosis ([place]) Long Term Goal(s): Safe transition to appropriate next level of care at discharge, Engage patient in therapeutic group addressing interpersonal concerns.  Short Term Goals: Engage patient in aftercare planning with referrals and resources, Identify triggers associated with mental health/substance abuse issues and Increase skills for wellness and recovery  Therapeutic Interventions: Assess for all discharge needs, 1 to 1 time with Social worker, Explore available resources and support systems, Assess for adequacy in community support network, Educate family and significant other(s) on suicide prevention, Complete Psychosocial Assessment, Interpersonal group therapy.  Evaluation of Outcomes: Not Met   Progress in Treatment: Attending groups: Pt is new to milieu, continuing to assess  Participating in groups: Pt is new to milieu, continuing to assess  Taking medication as  prescribed: Yes, MD continues to assess for medication changes as needed Toleration medication: Yes, no side effects reported at this time Family/Significant other contact made: No, CSW assessing for appropriate contact Patient understands diagnosis: Yes AEB seeking help with depression Discussing patient identified problems/goals with staff: Yes Medical problems stabilized or resolved: Yes Denies suicidal/homicidal ideation:  Yes Issues/concerns per patient self-inventory: None Other: N/A  New problem(s) identified: None identified at this time.   New Short Term/Long Term Goal(s): None identified at this time.   Discharge Plan or Barriers: CSW will assess for appropriate discharge plan and relevant barriers.   Reason for Continuation of Hospitalization: Anxiety Depression Medication stabilization Suicidal ideation  Estimated Length of Stay: 3-5 days  Attendees: Patient: 08/07/2016  8:36 AM  Physician: Dr. Parke Poisson 08/07/2016  8:36 AM  Nursing: Eulogio Bear, Raynaldo Opitz I.,RN 08/07/2016  8:36 AM  RN Care Manager: Lars Pinks, RN 08/07/2016  8:36 AM  Social Worker: Peri Maris, LCSW; Erasmo Downer Drinkard, LCSW 08/07/2016  8:36 AM  Recreational Therapist:  08/07/2016  8:36 AM  Other: Lindell Spar, NP; Samuel Jester, NP 08/07/2016  8:36 AM  Other:  08/07/2016  8:36 AM  Other: 08/07/2016  8:36 AM    Scribe for Treatment Team: Bo Mcclintock, LCSW 08/07/2016 8:36 AM

## 2016-08-07 NOTE — Progress Notes (Signed)
Inpatient Diabetes Program Recommendations  AACE/ADA: New Consensus Statement on Inpatient Glycemic Control (2015)  Target Ranges:  Prepandial:   less than 140 mg/dL      Peak postprandial:   less than 180 mg/dL (1-2 hours)      Critically ill patients:  140 - 180 mg/dL   Results for Johnathan Steele, Johnathan Steele (MRN [id number]) as of 08/07/2016 09:45  Ref. Range 08/06/2016 08:45 08/06/2016 20:18  Glucose-Capillary Latest Ref Range: 65 - 99 mg/dL 409300 (H) 811212 (H)   Results for Johnathan Steele, Johnathan Steele (MRN [id number]) as of 08/07/2016 09:45  Ref. Range 08/07/2016 06:09  Glucose-Capillary Latest Ref Range: 65 - 99 mg/dL 213235 (H)    Admit with: MDD (major depressive disorder)  History: DM  Home DM Meds: Metformin 1000 mg bid  Current Insulin Orders: Metformin 1000 mg bid      -Current A1c pending.  -Having some glucose elevations >200 mg/dl.     MD- Please consider starting Novolog Moderate Correction Scale/ SSI (0-15 units) TID AC + HS    --Will follow patient during hospitalization--  Ambrose FinlandJeannine Johnston Fishel RN, MSN, CDE Diabetes Coordinator Inpatient Glycemic Control Team Team Pager: [phone] (8a-5p)

## 2016-08-07 NOTE — Progress Notes (Addendum)
[facility] MD Progress Note  08/07/2016 1:13 PM Johnathan Steele  MRN:  [id number] Subjective: Patient states he is feeling partially better, although still depressed, sad. States marital stressors have been a contributing factor to depression, but less so now, as his spouse and him have been " working on our relationship and communicating better ".  At this time denies suicidal ideations . Objective : I have discussed case with treatment team and have met with patient . Patient is a 40 year old male, presented to hospital with worsening depression, suicidal ideations, thoughts of staging a robbery in hopes of being shot . He states he has been depressed " for a while", with history of passive SI, as noted, more recently has been having active SI. Endorses social isolation, anhedonia, low energy. Reports some improvement compared to prior to admission, but continues to feel depressed and presents with a constricted, although partially reactive, affect . Denies medication side effects at this time  Cooperative , pleasant on approach- limited group, milieu participation at this time.  Principal Problem: MDD (major depressive disorder), recurrent severe, without psychosis ([place]) Diagnosis:   Patient Active Problem List   Diagnosis Date Noted  . MDD (major depressive disorder), recurrent severe, without psychosis ([place]) [F33.2] 08/06/2016  . Migraine with aura [G43.109] 09/08/2013  . OSA (obstructive sleep apnea) [G47.33] 09/08/2013   Total Time spent with patient: 20 minutes    Past Medical History:  Past Medical History:  Diagnosis Date  . Depression   . Diabetes mellitus   . Hypertension   . Migraine   . Migraine with aura 09/08/2013  . Migraines   . OSA (obstructive sleep apnea) 09/08/2013    Past Surgical History:  Procedure Laterality Date  . SHOULDER SURGERY    . SKIN BIOPSY     Family History:  Family History  Problem Relation Age of Onset  . Mental illness Neg Hx     Social  History:  History  Alcohol Use No     History  Drug Use No    Social History   Social History  . Marital status: Married    Spouse name: N/A  . Number of children: N/A  . Years of education: N/A   Social History Main Topics  . Smoking status: Never Smoker  . Smokeless tobacco: Never Used  . Alcohol use No  . Drug use: No  . Sexual activity: Not Asked   Other Topics Concern  . None   Social History Narrative  . None   Additional Social History:   Sleep: Good  Appetite:  Fair  Current Medications: Current Facility-Administered Medications  Medication Dose Route Frequency Provider Last Rate Last Dose  . acetaminophen (TYLENOL) tablet 650 mg  650 mg Oral Q6H PRN Lurena Nida, NP      . buPROPion (WELLBUTRIN XL) 24 hr tablet 150 mg  150 mg Oral Daily Lurena Nida, NP   150 mg at 08/07/16 0827  . Cariprazine HCl CAPS 3 mg  3 mg Oral Daily Jenne Campus, MD   3 mg at 08/07/16 0827  . [START ON 08/08/2016] FLUoxetine (PROZAC) capsule 20 mg  20 mg Oral Daily Myer Peer Cobos, MD      . losartan (COZAAR) tablet 100 mg  100 mg Oral Daily Lurena Nida, NP   100 mg at 08/07/16 0827  . metFORMIN (GLUCOPHAGE) tablet 1,000 mg  1,000 mg Oral BID WC Lurena Nida, NP   1,000 mg at 08/07/16  0827  . metoprolol succinate (TOPROL-XL) 24 hr tablet 100 mg  100 mg Oral Q12H Lurena Nida, NP   100 mg at 08/07/16 0846  . traZODone (DESYREL) tablet 50 mg  50 mg Oral QHS,MR X 1 Derrill Center, NP   50 mg at 08/06/16 2136    Lab Results:  Results for orders placed or performed during the hospital encounter of 08/06/16 (from the past 48 hour(s))  Lipid panel     Status: Abnormal   Collection Time: 08/06/16  6:17 AM  Result Value Ref Range   Cholesterol 242 (H) 0 - 200 mg/dL   Triglycerides 346 (H) <150 mg/dL   HDL 42 >40 mg/dL   Total CHOL/HDL Ratio 5.8 RATIO   VLDL 69 (H) 0 - 40 mg/dL   LDL Cholesterol 131 (H) 0 - 99 mg/dL    Comment:        Total Cholesterol/HDL:CHD Risk Coronary  Heart Disease Risk Table                     Men   Women  1/2 Average Risk   3.4   3.3  Average Risk       5.0   4.4  2 X Average Risk   9.6   7.1  3 X Average Risk  23.4   11.0        Use the calculated Patient Ratio above and the CHD Risk Table to determine the patient's CHD Risk.        ATP III CLASSIFICATION (LDL):  <100     mg/dL   Optimal  100-129  mg/dL   Near or Above                    Optimal  130-159  mg/dL   Borderline  160-189  mg/dL   High  >190     mg/dL   Very High Performed at [facility]   Hemoglobin A1c     Status: Abnormal   Collection Time: 08/06/16  6:17 AM  Result Value Ref Range   Hgb A1c MFr Bld 7.5 (H) 4.8 - 5.6 %    Comment: (NOTE)         Pre-diabetes: 5.7 - 6.4         Diabetes: >6.4         Glycemic control for adults with diabetes: <7.0    Mean Plasma Glucose 169 mg/dL    Comment: (NOTE) Performed At: [facility] [place], [place] [id number] Lindon Romp MD [id number] Performed at [facility]   Glucose, capillary     Status: Abnormal   Collection Time: 08/06/16  8:45 AM  Result Value Ref Range   Glucose-Capillary 300 (H) 65 - 99 mg/dL   Comment 1 Notify RN    Comment 2 Document in Chart   Glucose, capillary     Status: Abnormal   Collection Time: 08/06/16  8:18 PM  Result Value Ref Range   Glucose-Capillary 212 (H) 65 - 99 mg/dL   Comment 1 Notify RN   Glucose, capillary     Status: Abnormal   Collection Time: 08/07/16  6:09 AM  Result Value Ref Range   Glucose-Capillary 235 (H) 65 - 99 mg/dL  TSH     Status: None   Collection Time: 08/07/16  6:11 AM  Result Value Ref Range   TSH 0.598 0.350 - 4.500 uIU/mL    Comment: Performed at  [facility]    Blood Alcohol level:  Lab Results  Component Value Date   ETH <5 08/67/6195    Metabolic Disorder Labs: Lab Results  Component Value Date   HGBA1C 7.5 (H) 08/06/2016   MPG 169 08/06/2016   No results  found for: PROLACTIN Lab Results  Component Value Date   CHOL 242 (H) 08/06/2016   TRIG 346 (H) 08/06/2016   HDL 42 08/06/2016   CHOLHDL 5.8 08/06/2016   VLDL 69 (H) 08/06/2016   LDLCALC 131 (H) 08/06/2016    Physical Findings: AIMS: Facial and Oral Movements Muscles of Facial Expression: None, normal Lips and Perioral Area: None, normal Jaw: None, normal Tongue: None, normal,Extremity Movements Upper (arms, wrists, hands, fingers): None, normal Lower (legs, knees, ankles, toes): None, normal, Trunk Movements Neck, shoulders, hips: None, normal, Overall Severity Severity of abnormal movements (highest score from questions above): None, normal Incapacitation due to abnormal movements: None, normal Patient's awareness of abnormal movements (rate only patient's report): No Awareness, Dental Status Current problems with teeth and/or dentures?: No Does patient usually wear dentures?: No  CIWA:    COWS:     Musculoskeletal: Strength & Muscle Tone: within normal limits Gait & Station: normal Patient leans: N/A  Psychiatric Specialty Exam: Physical Exam  ROS no chest pain, no shortness of breath, no vomiting   Blood pressure 117/71, pulse 81, temperature 99.5 F (37.5 C), temperature source Oral, resp. rate 18, height '6\' 1"'$  (1.854 m), weight (!) 322 lb (146.1 kg).Body mass index is 42.48 kg/m.  General Appearance: Fairly Groomed  Eye Contact:  Good  Speech:  Normal Rate  Volume:  Decreased  Mood:  Depressed  Affect:  constricted, but does smiles at times appropriately   Thought Process:  Linear  Orientation:  Full (Time, Place, and Person)  Thought Content:  denies hallucinations, no delusions   Suicidal Thoughts:  No- at this time denies suicidal ideations, denies self injurious ideations   Homicidal Thoughts:  No denies any homicidal or violent ideations   Memory:  recent and remote grossly intact   Judgement:  Fair  Insight:  Fair  Psychomotor Activity:  Normal   Concentration:  Concentration: Good and Attention Span: Good  Recall:  Good  Fund of Knowledge:  Good  Language:  Good  Akathisia:  Negative  Handed:  Right  AIMS (if indicated):     Assets:  Desire for Improvement Resilience  ADL's:  Intact  Cognition:  WNL  Sleep:  Number of Hours: 6.75   Assessment - 40 year old male, presented to hospital with worsening depression and suicidal ideations , at this time remains depressed, sad, but reports some improvement compared to admission and denies current SI, contracts for safety on the unit . At this time denies medication side effects- we have reviewed side effects, to include risk of cardiovascular side effects of Wellbutrin and GI/sexual side effects of Prozac   Treatment Plan Summary: Daily contact with patient to assess and evaluate symptoms and progress in treatment, Medication management, Plan inpatient treatment  and medications as below  Encourage group, milieu participation , to work on coping skills and symptom reduction  Treatment team working on disposition options  Continue Wellbutrin XL 150 mgrs QDAY for depression  Continue Prozac at 20 mgrs QDAY for depression  Continue DM management  Neita Garnet, MD 08/07/2016, 1:13 PM

## 2016-08-07 NOTE — Progress Notes (Signed)
Adult Psychoeducational Group Note  Date:  08/07/2016 Time:  9:00 PM  Group Topic/Focus:  Wrap-Up Group:   The focus of this group is to help patients review their daily goal of treatment and discuss progress on daily workbooks.   Participation Level:  Active  Participation Quality:  Appropriate  Affect:  Appropriate  Cognitive:  Appropriate and Oriented  Insight: Appropriate  Engagement in Group:  Engaged  Modes of Intervention:  Discussion  Additional Comments:  Patient attended wrap-up group and he said has day way a 10/10.  His goal for today was to meet with his physician and he did.  His goal for today is to be more open with people. Delicia W Forkpah 08/07/2016, 9:00 PM

## 2016-08-07 NOTE — Progress Notes (Signed)
DAR NOTE: Patient presents with anxious affect and depressed mood. Pt stayed in the bed most of the am. Denies pain, auditory and visual hallucinations.  Rates depression at 4, hopelessness at 2, and anxiety at 1.  Maintained on routine safety checks.  Medications given as prescribed.  Support and encouragement offered as needed.  Attended group and participated. Patient observed socializing with peers in the dayroom.  Offered no complaint.

## 2016-08-07 NOTE — Progress Notes (Signed)
Recreation Therapy Notes  Animal-Assisted Activity (AAA) Program Checklist/Progress Notes Patient Eligibility Criteria Checklist & Daily Group note for Rec TxIntervention  Date: 09.05.2017 Time: 2:45pm [address]    AAA/T Program Assumption of Risk Form signed by Patient/ or Parent Legal Guardian Yes  Patient is free of allergies or sever asthma Yes  Patient reports no fear of animals Yes  Patient reports no history of cruelty to animals Yes  Patient understands his/her participation is voluntary Yes  Patient washes hands before animal contact Yes  Patient washes hands after animal contact Yes  Behavioral Response: Engaged, Attentive   Education:Hand Washing, Appropriate Animal Interaction   Education Outcome: Acknowledges education.   Clinical Observations/Feedback: Patient attended session and interacted appropriately with therapy dog and peers.    Marykay Lexenise L Blanchfield, LRT/CTRS  Blanchfield, Denise L 08/07/2016 3:03 PM

## 2016-08-07 NOTE — Progress Notes (Signed)
Nutrition Note  Consult for hyperlipidemia education received. Per protocol, RN to provide pt with copy of packet outlining general healthy eating in addition to hyperlipidemia-specific information.   Labs reviewed; cholesterol: 242 mg/dL, triglycerides: 161346 mg/dL, HDL WDL, LDL: 096131 mg/dL, VLDL: 69 mg/dL.  No further nutrition intervention warranted at this time. Please re-consult if further nutrition-related issues arise.    Trenton GammonJessica Ostheim, MS, RD, LDN Inpatient Clinical Dietitian Pager # [phone] After hours/weekend pager # [phone]

## 2016-08-08 DIAGNOSIS — F332 Major depressive disorder, recurrent severe without psychotic features: Secondary | ICD-10-CM | POA: Diagnosis not present

## 2016-08-08 LAB — GLUCOSE, CAPILLARY
Glucose-Capillary: 244 mg/dL — ABNORMAL HIGH (ref 65–99)
Glucose-Capillary: 245 mg/dL — ABNORMAL HIGH (ref 65–99)
Glucose-Capillary: 281 mg/dL — ABNORMAL HIGH (ref 65–99)
Glucose-Capillary: 254 mg/dL — ABNORMAL HIGH (ref 65–99)

## 2016-08-08 LAB — PROLACTIN: Prolactin: 9.3 ng/mL (ref 4.0–15.2)

## 2016-08-08 MED ORDER — INSULIN ASPART 100 UNIT/ML ~~LOC~~ SOLN
0.0000 [IU] | Freq: Every day | SUBCUTANEOUS | Status: DC
  Administered 2016-08-08 – 2016-08-09 (×2): 3 [IU] via SUBCUTANEOUS

## 2016-08-08 MED ORDER — INSULIN ASPART 100 UNIT/ML ~~LOC~~ SOLN
0.0000 [IU] | Freq: Three times a day (TID) | SUBCUTANEOUS | Status: DC
  Administered 2016-08-08: 8 [IU] via SUBCUTANEOUS
  Administered 2016-08-08: 5 [IU] via SUBCUTANEOUS
  Administered 2016-08-09 (×2): 8 [IU] via SUBCUTANEOUS
  Administered 2016-08-09: 5 [IU] via SUBCUTANEOUS
  Administered 2016-08-10 (×2): 8 [IU] via SUBCUTANEOUS

## 2016-08-08 NOTE — Progress Notes (Signed)
Adult Psychoeducational Group Note  Date:  08/08/2016 Time:  9:13 PM  Group Topic/Focus:  Wrap-Up Group:   The focus of this group is to help patients review their daily goal of treatment and discuss progress on daily workbooks.   Participation Level:  Active  Participation Quality:  Appropriate  Affect:  Appropriate  Cognitive:  Alert, Appropriate and Oriented  Insight: Appropriate  Engagement in Group:  Engaged  Modes of Intervention:  Discussion  Additional Comments:  Patient attended wrap-up group and said his day was a 8.  His goal for today was to discuss discharge plans with his physician, unfortunately, that did not happened. He was not sure of his coping skills.   Delicia W Forkpah 08/08/2016, 9:13 PM

## 2016-08-08 NOTE — [facility] Group Notes (Addendum)
Pt attended spiritual care group on grief and loss facilitated by chaplain Matthew Stalnaker   Group opened with brief discussion and psycho-social ed around grief and loss in relationships and in relation to self - identifying life patterns, circumstances, changes that cause losses. Established group norm of speaking from own life experience. Group goal of establishing open and affirming space for members to share loss and experience with grief, normalize grief experience and provide psycho social education and grief support.     

## 2016-08-08 NOTE — Progress Notes (Signed)
Recreation Therapy Notes  Date: 08/08/16 Time: 0930 Location: [address] Group Room  Group Topic: Stress Management  Goal Area(s) Addresses:  Patient will verbalize importance of using healthy stress management.  Patient will identify positive emotions associated with healthy stress management.   Intervention: Stress Management  Activity :  Progressive Muscle Relaxation.  LRT introduced the technique of progressive muscle relaxation to patients.  LRT read script so patients to engage in technique.  Patients were to sit in a comfortable position and follow along as LRT read script.  Education:  Stress Management, Discharge Planning.   Education Outcome: Needs additional education  Clinical Observations/Feedback: Pt did not attend group.    Caroll RancherMarjette Lindsay, LRT/CTRS         Caroll RancherLindsay, Marjette A 08/08/2016 12:28 PM

## 2016-08-08 NOTE — Progress Notes (Addendum)
Patient ID: Johnathan Steele, male   DOB: [birth year], 40 y.o.   MRN: [id number]  DAR: Pt. Denies SI/HI and A/V Hallucinations. He reports sleep is good, appetite is good, energy level is normal, and concentration is good. He rates depression, hopelessness, and anxiety 0/10. Writer explained to patient that he will receive Insulin on a sliding scale while he was at [facility] if order parameters are met. He appeared disappointed but verbalized understanding. Patient does not report any pain or discomfort at this time. Support and encouragement provided to the patient . Scheduled medications administered to patient per physician's orders. Patient is minimal but cooperative. He is seen in the milieu and is attending some groups. Q15 minute checks are maintained for safety.

## 2016-08-08 NOTE — Plan of Care (Signed)
Problem: Medication: Goal: Compliance with prescribed medication regimen will improve Outcome: Progressing Pt has been compliant with scheduled medication tonight.    

## 2016-08-08 NOTE — [facility] Group Notes (Signed)
[facility] LCSW Group Therapy 08/08/2016 1:15 PM  Type of Therapy: Group Therapy- Emotion Regulation  Participation Level: Active   Participation Quality:  Appropriate  Affect: Appropriate  Cognitive: Alert and Oriented   Insight:  Developing/Improving  Engagement in Therapy: Developing/Improving and Engaged   Modes of Intervention: Clarification, Confrontation, Discussion, Education, Exploration, Limit-setting, Orientation, Problem-solving, Rapport Building, Dance movement psychotherapisteality Testing, Socialization and Support  Summary of Progress/Problems: The topic for group today was emotional regulation. This group focused on both positive and negative emotion identification and allowed group members to process ways to identify feelings, regulate negative emotions, and find healthy ways to manage internal/external emotions. Group members were asked to reflect on a time when their reaction to an emotion led to a negative outcome and explored how alternative responses using emotion regulation would have benefited them. Group members were also asked to discuss a time when emotion regulation was utilized when a negative emotion was experienced. Pt participated actively in discussion and was able to identify characteristics of anxiety and depression as they are difficult emotions for him to regulate. He expressed a desire to utilize his support system more effectively to regulate his emotions. Pt was able to offer suggestions to the group related to successful strategies for emotion regulation that he has used in the past.    Johnathan CordialLauren Carter, LCSWA 08/08/2016 2:24 PM

## 2016-08-08 NOTE — Progress Notes (Signed)
D: Pt was in the day room upon initial approach.  He describes his day as "pretty good."  Pt reports his goal is to "continue to improve so I can get out of here."  Pt has depressed affect and mood.  Pt denies SI/HI, denies hallucinations, denies pain.  Pt has been visible in milieu interacting with peers and staff appropriately.  Pt attended evening group.   A: Introduced self to pt.  Met with pt and offered support and encouragement.  Actively listened to pt.  Medication education provided.  Medication administered per order.  PRN medication administered for sleep. R: Pt is compliant with medications.  Pt verbally contracts for safety.  Will continue to monitor and assess.

## 2016-08-08 NOTE — Progress Notes (Signed)
Patient ID: Johnathan Steele, male   DOB: [birth year], 40 y.o.   MRN: [id number] [facility] MD Progress Note  08/08/2016 12:43 PM DEMARKUS REMMEL  MRN:  [id number] Subjective: Patient reports gradual improvement compared to his mood prior to admission . States he has some lingering sense of depression, but overall much improved . Denies any suicidal ideations. Denies any medication side effects at this time . Objective : I have discussed case with treatment team and have met with patient . Patient presents with improving mood and range of affect, although affect still somewhat constricted . States he is feeling better and is rating depression as low at this time. Denies medication side effects . No disruptive or agitated behaviors on unit, visible in day room. TSH WNL.  We have reviewed labs - patient has mild hypercholesterolemia, hypertriglyceridemia, and AST and ALT are elevated . Of note, denies any associated symptoms- no RUQ pain or discomfort, no acholia or choluria, denies any history of viral hepatitis . He does state he has been told by PCP he has Fatty Liver . We reviewed advantages of weight loss for his overall physical health ( as above, reports history of Sleep Apnea, Depression, HTN, Fatty Liver )   Principal Problem: MDD (major depressive disorder), recurrent severe, without psychosis (HCC) Diagnosis:   Patient Active Problem List   Diagnosis Date Noted  . MDD (major depressive disorder), recurrent severe, without psychosis (HCC) [F33.2] 08/06/2016  . Migraine with aura [G43.109] 09/08/2013  . OSA (obstructive sleep apnea) [G47.33] 09/08/2013   Total Time spent with patient: 20 minutes    Past Medical History:  Past Medical History:  Diagnosis Date  . Depression   . Diabetes mellitus   . Hypertension   . Migraine   . Migraine with aura 09/08/2013  . Migraines   . OSA (obstructive sleep apnea) 09/08/2013    Past Surgical History:  Procedure Laterality Date  .  SHOULDER SURGERY    . SKIN BIOPSY     Family History:  Family History  Problem Relation Age of Onset  . Mental illness Neg Hx     Social History:  History  Alcohol Use No     History  Drug Use No    Social History   Social History  . Marital status: Married    Spouse name: N/A  . Number of children: N/A  . Years of education: N/A   Social History Main Topics  . Smoking status: Never Smoker  . Smokeless tobacco: Never Used  . Alcohol use No  . Drug use: No  . Sexual activity: Not Asked   Other Topics Concern  . None   Social History Narrative  . None   Additional Social History:   Sleep: Good  Appetite:  Good   Current Medications: Current Facility-Administered Medications  Medication Dose Route Frequency Provider Last Rate Last Dose  . acetaminophen (TYLENOL) tablet 650 mg  650 mg Oral Q6H PRN Kristeen Mans, NP      . buPROPion (WELLBUTRIN XL) 24 hr tablet 150 mg  150 mg Oral Daily Kristeen Mans, NP   150 mg at 08/08/16 0756  . Cariprazine HCl CAPS 3 mg  3 mg Oral Daily Craige Cotta, MD   3 mg at 08/08/16 0757  . FLUoxetine (PROZAC) capsule 20 mg  20 mg Oral Daily Craige Cotta, MD   20 mg at 08/08/16 0757  . insulin aspart (novoLOG) injection 0-15 Units  0-15 Units Subcutaneous TID  WC Kerrie Buffalo, NP   5 Units at 08/08/16 1206  . insulin aspart (novoLOG) injection 0-5 Units  0-5 Units Subcutaneous QHS Kerrie Buffalo, NP      . losartan (COZAAR) tablet 100 mg  100 mg Oral Daily Lurena Nida, NP   100 mg at 08/08/16 0756  . metFORMIN (GLUCOPHAGE) tablet 1,000 mg  1,000 mg Oral BID WC Lurena Nida, NP   1,000 mg at 08/08/16 0756  . metoprolol succinate (TOPROL-XL) 24 hr tablet 100 mg  100 mg Oral Q12H Lurena Nida, NP   100 mg at 08/08/16 0757  . traZODone (DESYREL) tablet 50 mg  50 mg Oral QHS PRN Jenne Campus, MD   50 mg at 08/07/16 2109    Lab Results:  Results for orders placed or performed during the hospital encounter of 08/06/16 (from  the past 48 hour(s))  Glucose, capillary     Status: Abnormal   Collection Time: 08/06/16  8:18 PM  Result Value Ref Range   Glucose-Capillary 212 (H) 65 - 99 mg/dL   Comment 1 Notify RN   Glucose, capillary     Status: Abnormal   Collection Time: 08/07/16  6:09 AM  Result Value Ref Range   Glucose-Capillary 235 (H) 65 - 99 mg/dL  Prolactin     Status: None   Collection Time: 08/07/16  6:11 AM  Result Value Ref Range   Prolactin 9.3 4.0 - 15.2 ng/mL    Comment: (NOTE) Performed At: [facility] [place], [place] [id number] Lindon Romp MD [id number] Performed at [facility]   TSH     Status: None   Collection Time: 08/07/16  6:11 AM  Result Value Ref Range   TSH 0.598 0.350 - 4.500 uIU/mL    Comment: Performed at [facility]  Glucose, capillary     Status: Abnormal   Collection Time: 08/08/16  5:51 AM  Result Value Ref Range   Glucose-Capillary 244 (H) 65 - 99 mg/dL   Comment 1 Notify RN   Glucose, capillary     Status: Abnormal   Collection Time: 08/08/16 12:01 PM  Result Value Ref Range   Glucose-Capillary 245 (H) 65 - 99 mg/dL    Blood Alcohol level:  Lab Results  Component Value Date   ETH <5 16/60/6301    Metabolic Disorder Labs: Lab Results  Component Value Date   HGBA1C 7.5 (H) 08/06/2016   MPG 169 08/06/2016   Lab Results  Component Value Date   PROLACTIN 9.3 08/07/2016   Lab Results  Component Value Date   CHOL 242 (H) 08/06/2016   TRIG 346 (H) 08/06/2016   HDL 42 08/06/2016   CHOLHDL 5.8 08/06/2016   VLDL 69 (H) 08/06/2016   LDLCALC 131 (H) 08/06/2016    Physical Findings: AIMS: Facial and Oral Movements Muscles of Facial Expression: None, normal Lips and Perioral Area: None, normal Jaw: None, normal Tongue: None, normal,Extremity Movements Upper (arms, wrists, hands, fingers): None, normal Lower (legs, knees, ankles, toes): None, normal, Trunk Movements Neck, shoulders,  hips: None, normal, Overall Severity Severity of abnormal movements (highest score from questions above): None, normal Incapacitation due to abnormal movements: None, normal Patient's awareness of abnormal movements (rate only patient's report): No Awareness, Dental Status Current problems with teeth and/or dentures?: No Does patient usually wear dentures?: No  CIWA:    COWS:     Musculoskeletal: Strength & Muscle Tone: within normal limits Gait & Station: normal  Patient leans: N/A  Psychiatric Specialty Exam: Physical Exam  ROS no chest pain, no shortness of breath, no vomiting , no RUQ pain, no acholia, no choluria   Blood pressure 132/78, pulse 80, temperature 98.9 F (37.2 C), temperature source Oral, resp. rate 18, height '6\' 1"'$  (1.854 m), weight (!) 322 lb (146.1 kg).Body mass index is 42.48 kg/m.  General Appearance: improved grooming  Eye Contact:  Good  Speech:  Normal Rate  Volume:  Normal  Mood:  Depressed, but improving compared to admission, states he feels better   Affect:  Less constricted, smiles often and appropriately   Thought Process:  Linear  Orientation:  Full (Time, Place, and Person)  Thought Content:  denies hallucinations, no delusions   Suicidal Thoughts:  No- at this time denies suicidal ideations, denies self injurious ideations   Homicidal Thoughts:  No denies any homicidal or violent ideations   Memory:  recent and remote grossly intact   Judgement:  Improving   Insight:   Improving   Psychomotor Activity:  Normal  Concentration:  Concentration: Good and Attention Span: Good  Recall:  Good  Fund of Knowledge:  Good  Language:  Good  Akathisia:  Negative  Handed:  Right  AIMS (if indicated):     Assets:  Desire for Improvement Resilience  ADL's:  Intact  Cognition:  WNL  Sleep:  Number of Hours: 6.25   Assessment - improving mood and range of affect - presents somewhat constricted, but affect more reactive, denies suicidal ideations at this  time . Thus far tolerating medications well . As he improves , he is focusing more on discharge planning . Of note, elevated AST, ALT, reports history of Fatty Liver diagnosis in the past, no associated symptoms .  Treatment Plan Summary: Daily contact with patient to assess and evaluate symptoms and progress in treatment, Medication management, Plan inpatient treatment  and medications as below  Encourage group, milieu participation , to work on coping skills and symptom reduction  Treatment team working on disposition options  Continue Wellbutrin XL 150 mgrs QDAY for depression  Continue Prozac at 20 mgrs QDAY for depression  Continue DM management  Agrees to Viral Hepatitis work up - check Hep C Ab  and Heb B SAg  Neita Garnet, MD 08/08/2016, 12:43 PM
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
## 2016-08-08 NOTE — Plan of Care (Signed)
Problem: Health Behavior/Discharge Planning: Goal: Compliance with therapeutic regimen will improve Outcome: Progressing Patient is seen more in the milieu today and is attending some groups.

## 2016-08-09 LAB — GLUCOSE, CAPILLARY
Glucose-Capillary: 240 mg/dL — ABNORMAL HIGH (ref 65–99)
Glucose-Capillary: 276 mg/dL — ABNORMAL HIGH (ref 65–99)
Glucose-Capillary: 260 mg/dL — ABNORMAL HIGH (ref 65–99)
Glucose-Capillary: 289 mg/dL — ABNORMAL HIGH (ref 65–99)

## 2016-08-09 LAB — HEPATITIS C ANTIBODY: HCV Ab: <0.1 s/co ratio (ref 0.0–0.9)

## 2016-08-09 LAB — HEPATITIS B SURFACE ANTIGEN: Hepatitis B Surface Ag: NEGATIVE

## 2016-08-09 MED ORDER — HYDROXYZINE HCL 50 MG PO TABS
50.0000 mg | ORAL_TABLET | Freq: Four times a day (QID) | ORAL | Status: DC | PRN
  Filled 2016-08-09: qty 1

## 2016-08-09 NOTE — [facility] Suicide Risk Assessment (Signed)
[facility] INPATIENT:  Family/Significant Other Suicide Prevention Education  Suicide Prevention Education:  Education Completed; Johnathan Steele, Pt's wife [phone], has been identified by the patient as the family member/significant other with whom the patient will be residing, and identified as the person(s) who will aid the patient in the event of a mental health crisis (suicidal ideations/suicide attempt).  With written consent from the patient, the family member/significant other has been provided the following suicide prevention education, prior to the and/or following the discharge of the patient.  The suicide prevention education provided includes the following:  Suicide risk factors  Suicide prevention and interventions  National Suicide Hotline telephone number  [facility] assessment telephone number  [facility] Assistance 911  [facility] and/or Residential [facility] Crisis [facility] telephone number  Request made of family/significant other to:  Remove weapons (e.g., guns, rifles, knives), all items previously/currently identified as safety concern.    Remove drugs/medications (over-the-counter, prescriptions, illicit drugs), all items previously/currently identified as a safety concern.  The family member/significant other verbalizes understanding of the suicide prevention education information provided.  The family member/significant other agrees to remove the items of safety concern listed above.  Johnathan Steele 08/09/2016, 12:44 PM

## 2016-08-09 NOTE — Progress Notes (Signed)
DAR NOTE: Patient presents with anxious affect and depressed mood. Pt keep to himself most of the time, not interacting much. Denies pain, auditory and visual hallucinations.  Rates depression at 0, hopelessness at 0, and anxiety at 0.  Maintained on routine safety checks.  Medications given as prescribed.  Support and encouragement offered as needed.  Attended group and participated.  States goal for today is "leaving."  Offered no complaint.

## 2016-08-09 NOTE — [facility] Group Notes (Signed)
Adult Psychoeducational Group Note  Date:  08/09/2016 Time:  0900-0930  Group Topic/Focus:  Nurse education group: Leisure and lifestyle changes.  First patient's identified one gift, one stressor, and one leisure activity.  We discussed the importance of leisure and scheduling leisure activities to provide work/life balance.  Participation Level:  Active  Participation Quality:  Appropriate and Attentive  Affect:  Depressed  Cognitive:  Alert and Appropriate  Insight: Appropriate  Engagement in Group:  Engaged  Modes of Intervention:  Activity, Clarification, Education and Rapport Building  Additional Comments:  Patient attended for full length of group.  Listed a gift, a stressor, and a leisure activity.

## 2016-08-09 NOTE — Progress Notes (Signed)
Nutrition Education Note  Pt attended group focusing on general, healthful nutrition education.  RD emphasized the importance of eating regular meals and snacks throughout the day. Consuming sugar-free beverages and incorporating fruits and vegetables into diet when possible. Provided examples of healthy snacks. Patient encouraged to leave group with a goal to improve nutrition/healthy eating.   Diet Order: Diet heart healthy/carb modified Room service appropriate? Yes; Fluid consistency: Thin Pt is also offered choice of unit snacks mid-morning and mid-afternoon.  Pt is eating as desired.   If additional nutrition issues arise, please consult RD.    Trenton GammonJessica Ostheim, MS, RD, LDN Inpatient Clinical Dietitian Pager # [phone] After hours/weekend pager # [phone]

## 2016-08-09 NOTE — [facility] Group Notes (Signed)
[facility] Mental Health Association Group Therapy 08/09/2016 1:15pm  Type of Therapy: Mental Health Association Presentation  Participation Level: Active  Participation Quality: Attentive  Affect: Appropriate  Cognitive: Oriented  Insight: Developing/Improving  Engagement in Therapy: Engaged  Modes of Intervention: Discussion, Education and Socialization  Summary of Progress/Problems: Mental Health Association (MHA) Speaker came to talk about his personal journey with substance abuse and addiction. The pt processed ways by which to relate to the speaker. MHA speaker provided handouts and educational information pertaining to groups and services offered by the MHA. Pt was engaged in speaker's presentation and was receptive to resources provided.    Lauren Carter, LCSWA 08/09/2016 1:32 PM  

## 2016-08-09 NOTE — Progress Notes (Signed)
   D: Writer observed the pt on the telephone prior to the assessment. However, during the assessment the pt had poor eye contact with the Clinical research associatewriter. Pt would not engage the writer in in depth conversation, but did answer yes or no questions. Pt has no questions or concerns.    A:  Support and encouragement was offered. 15 min checks continued for safety.  R: Pt remains safe.

## 2016-08-09 NOTE — Progress Notes (Signed)
Inpatient Diabetes Program Recommendations  AACE/ADA: New Consensus Statement on Inpatient Glycemic Control (2015)  Target Ranges:  Prepandial:   less than 140 mg/dL      Peak postprandial:   less than 180 mg/dL (1-2 hours)      Critically ill patients:  140 - 180 mg/dL   Results for Johnathan Steele, Johnathan Steele (MRN [id number]) as of 08/09/2016 09:52  Ref. Range 08/08/2016 05:51 08/08/2016 12:01 08/08/2016 17:19 08/08/2016 20:40  Glucose-Capillary Latest Ref Range: 65 - 99 mg/dL 409244 (H) 811245 (H) 914281 (H) 254 (H)   Results for Johnathan Steele, Johnathan Steele (MRN [id number]) as of 08/09/2016 09:52  Ref. Range 08/09/2016 05:53  Glucose-Capillary Latest Ref Range: 65 - 99 mg/dL 086240 (H)   Results for Johnathan Steele, Johnathan Steele (MRN [id number]) as of 08/09/2016 09:52  Ref. Range 08/06/2016 06:17  Hemoglobin A1C Latest Ref Range: 4.8 - 5.6 % 7.5 (H)    Admit with: MDD (major depressive disorder)  History: DM  Home DM Meds: Metformin 1000 mg bid  Current Insulin Orders: Metformin 1000 mg bid      Novolog Moderate Correction Scale/ SSI (0-15 units) TID AC + HS      -Glucose levels remain elevated.  May be eating more than usual in the hospital.  -Note Novolog SSI started yesterday at 12pm.  -Current A1c of 7.5% shows decent glucose control at home with Metformin.     MD- Please consider starting low dose basal insulin for patient while hospitalized:  Lantus 15 units daily (0.1 units/kg dosing)     --Will follow patient during hospitalization--  Ambrose FinlandJeannine Johnston Fishel RN, MSN, CDE Diabetes Coordinator Inpatient Glycemic Control Team Team Pager: [phone] (8a-5p)

## 2016-08-09 NOTE — Progress Notes (Signed)
Patient did attend the evening karaoke group. Pt was engaged, supportive, and participated by singing a song.  

## 2016-08-09 NOTE — Progress Notes (Signed)
[facility] MD Progress Note  08/09/2016 2:26 PM Johnathan Steele  MRN:  [id number] Subjective:  Patient is bright today and asks when he will be discharged.    Objective: I have discussed case with treatment team and have met with patient . Patient presents with improving mood and range of affect, although affect still somewhat constricted. States he is feeling better and is rating depression as low at this time. Denies medication side effects . No disruptive or agitated behaviors on unit, visible in day room.   Principal Problem: MDD (major depressive disorder), recurrent severe, without psychosis (Johnathan Steele) Diagnosis:   Patient Active Problem List   Diagnosis Date Noted  . MDD (major depressive disorder), recurrent severe, without psychosis (Johnathan Steele) [F33.2] 08/06/2016  . Migraine with aura [G43.109] 09/08/2013  . OSA (obstructive sleep apnea) [G47.33] 09/08/2013   Total Time spent with patient: 20 minutes    Past Medical History:  Past Medical History:  Diagnosis Date  . Depression   . Diabetes mellitus   . Hypertension   . Migraine   . Migraine with aura 09/08/2013  . Migraines   . OSA (obstructive sleep apnea) 09/08/2013    Past Surgical History:  Procedure Laterality Date  . SHOULDER SURGERY    . SKIN BIOPSY     Family History:  Family History  Problem Relation Age of Onset  . Mental illness Neg Hx     Social History:  History  Alcohol Use No     History  Drug Use No    Social History   Social History  . Marital status: Married    Spouse name: N/A  . Number of children: N/A  . Years of education: N/A   Social History Main Topics  . Smoking status: Never Smoker  . Smokeless tobacco: Never Used  . Alcohol use No  . Drug use: No  . Sexual activity: Not Asked   Other Topics Concern  . None   Social History Narrative  . None   Additional Social History:   Sleep: Good  Appetite:  Good   Current Medications: Current Facility-Administered Medications   Medication Dose Route Frequency Provider Last Rate Last Dose  . acetaminophen (TYLENOL) tablet 650 mg  650 mg Oral Q6H PRN Lurena Nida, NP      . buPROPion (WELLBUTRIN XL) 24 hr tablet 150 mg  150 mg Oral Daily Lurena Nida, NP   150 mg at 08/09/16 9024  . Cariprazine HCl CAPS 3 mg  3 mg Oral Daily Jenne Campus, MD   3 mg at 08/09/16 0813  . FLUoxetine (PROZAC) capsule 20 mg  20 mg Oral Daily Jenne Campus, MD   20 mg at 08/09/16 0813  . hydrOXYzine (ATARAX/VISTARIL) tablet 50 mg  50 mg Oral Q6H PRN Laverle Hobby, PA-C      . insulin aspart (novoLOG) injection 0-15 Units  0-15 Units Subcutaneous TID WC Kerrie Buffalo, NP   8 Units at 08/09/16 1208  . insulin aspart (novoLOG) injection 0-5 Units  0-5 Units Subcutaneous QHS Kerrie Buffalo, NP   3 Units at 08/08/16 2206  . losartan (COZAAR) tablet 100 mg  100 mg Oral Daily Lurena Nida, NP   100 mg at 08/09/16 0973  . metFORMIN (GLUCOPHAGE) tablet 1,000 mg  1,000 mg Oral BID WC Lurena Nida, NP   1,000 mg at 08/09/16 5329  . metoprolol succinate (TOPROL-XL) 24 hr tablet 100 mg  100 mg Oral Q12H Lurena Nida, NP  100 mg at 08/09/16 0813  . traZODone (DESYREL) tablet 50 mg  50 mg Oral QHS PRN Jenne Campus, MD   50 mg at 08/08/16 2206    Lab Results:  Results for orders placed or performed during the hospital encounter of 08/06/16 (from the past 48 hour(s))  Glucose, capillary     Status: Abnormal   Collection Time: 08/08/16  5:51 AM  Result Value Ref Range   Glucose-Capillary 244 (H) 65 - 99 mg/dL   Comment 1 Notify RN   Glucose, capillary     Status: Abnormal   Collection Time: 08/08/16 12:01 PM  Result Value Ref Range   Glucose-Capillary 245 (H) 65 - 99 mg/dL  Glucose, capillary     Status: Abnormal   Collection Time: 08/08/16  5:19 PM  Result Value Ref Range   Glucose-Capillary 281 (H) 65 - 99 mg/dL  Hepatitis B surface antigen     Status: None   Collection Time: 08/08/16  6:25 PM  Result Value Ref Range    Hepatitis B Surface Ag Negative Negative    Comment: (NOTE) Performed At: [facility] [place], [place] [id number] Lindon Romp MD [id number] Performed at [facility]   Hepatitis C antibody     Status: None   Collection Time: 08/08/16  6:25 PM  Result Value Ref Range   HCV Ab <0.1 0.0 - 0.9 s/co ratio    Comment: (NOTE)                                  Negative:     < 0.8                             Indeterminate: 0.8 - 0.9                                  Positive:     > 0.9 The CDC recommends that a positive HCV antibody result be followed up with a HCV Nucleic Acid Amplification test (300762). Performed At: [facility] [place], [place] [id number] Lindon Romp MD [id number] Performed at [facility]   Glucose, capillary     Status: Abnormal   Collection Time: 08/08/16  8:40 PM  Result Value Ref Range   Glucose-Capillary 254 (H) 65 - 99 mg/dL   Comment 1 Notify RN   Glucose, capillary     Status: Abnormal   Collection Time: 08/09/16  5:53 AM  Result Value Ref Range   Glucose-Capillary 240 (H) 65 - 99 mg/dL   Comment 1 Notify RN   Glucose, capillary     Status: Abnormal   Collection Time: 08/09/16 11:56 AM  Result Value Ref Range   Glucose-Capillary 276 (H) 65 - 99 mg/dL    Blood Alcohol level:  Lab Results  Component Value Date   ETH <5 76/81/1572    Metabolic Disorder Labs: Lab Results  Component Value Date   HGBA1C 7.5 (H) 08/06/2016   MPG 169 08/06/2016   Lab Results  Component Value Date   PROLACTIN 9.3 08/07/2016   Lab Results  Component Value Date   CHOL 242 (H) 08/06/2016   TRIG 346 (H) 08/06/2016   HDL 42 08/06/2016   CHOLHDL 5.8 08/06/2016   VLDL 69 (H)  08/06/2016   LDLCALC 131 (H) 08/06/2016    Physical Findings: AIMS: Facial and Oral Movements Muscles of Facial Expression: None, normal Lips and Perioral Area: None, normal Jaw: None,  normal Tongue: None, normal,Extremity Movements Upper (arms, wrists, hands, fingers): None, normal Lower (legs, knees, ankles, toes): None, normal, Trunk Movements Neck, shoulders, hips: None, normal, Overall Severity Severity of abnormal movements (highest score from questions above): None, normal Incapacitation due to abnormal movements: None, normal Patient's awareness of abnormal movements (rate only patient's report): No Awareness, Dental Status Current problems with teeth and/or dentures?: No Does patient usually wear dentures?: No  CIWA:    COWS:     Musculoskeletal: Strength & Muscle Tone: within normal limits Gait & Station: normal Patient leans: N/A  Psychiatric Specialty Exam: Physical Exam  Nursing note and vitals reviewed. Psychiatric: He has a normal mood and affect. His speech is normal and behavior is normal. Judgment and thought content normal. Cognition and memory are normal.    Review of Systems  Constitutional: Negative.   HENT: Negative.   Eyes: Negative.   Respiratory: Negative.   Cardiovascular: Negative.   Gastrointestinal: Negative.   Genitourinary: Negative.   Musculoskeletal: Negative.   Skin: Negative.   Neurological: Negative.   Endo/Heme/Allergies: Negative.   Psychiatric/Behavioral: Negative.   All other systems reviewed and are negative.  no chest pain, no shortness of breath, no vomiting , no RUQ pain, no acholia, no choluria   Blood pressure 125/86, pulse 72, temperature 98.4 F (36.9 C), temperature source Oral, resp. rate 18, height _0  (1.854 m), weight (!) 146.1 kg (322 lb).Body mass index is 42.48 kg/m.  General Appearance: improved grooming  Eye Contact:  Good  Speech:  Normal Rate  Volume:  Normal  Mood:  Depressed, but improving compared to admission, states he feels better   Affect:  Less constricted, smiles often and appropriately   Thought Process:  Linear  Orientation:  Full (Time, Place, and Person)  Thought Content:   denies hallucinations, no delusions   Suicidal Thoughts:  No- at this time denies suicidal ideations, denies self injurious ideations   Homicidal Thoughts:  No denies any homicidal or violent ideations   Memory:  recent and remote grossly intact   Judgement:  Improving   Insight:   Improving   Psychomotor Activity:  Normal  Concentration:  Concentration: Good and Attention Span: Good  Recall:  Good  Fund of Knowledge:  Good  Language:  Good  Akathisia:  Negative  Handed:  Right  AIMS (if indicated):     Assets:  Desire for Improvement Resilience  ADL's:  Intact  Cognition:  WNL  Sleep:  Number of Hours: 5   Assessment - improving mood and range of affect - presents somewhat constricted, but affect more reactive, denies suicidal ideations at this time . Thus far tolerating medications well . As he improves, he is focusing more on discharge planning. Of note, elevated AST, ALT, reports history of Fatty Liver diagnosis in the past, no associated symptoms.  Treatment Plan Summary: Daily contact with patient to assess and evaluate symptoms and progress in treatment, Medication management, Plan inpatient treatment  and medications as below  Encourage group, milieu participation , to work on coping skills and symptom reduction  Treatment team working on disposition options  Continue Wellbutrin XL 150 mgrs QDAY for depression  Continue Prozac at 20 mgrs QDAY for depression  Continue DM management  Agrees to Viral Hepatitis work up - check Hep C Ab  and Heb B SAg ---results negative  Janett Labella, NP [facility] 08/09/2016, 2:26 PM   Agree with NP progress note as above  Dr. Parke Poisson

## 2016-08-10 DIAGNOSIS — F332 Major depressive disorder, recurrent severe without psychotic features: Secondary | ICD-10-CM | POA: Diagnosis not present

## 2016-08-10 LAB — GLUCOSE, CAPILLARY
Glucose-Capillary: 294 mg/dL — ABNORMAL HIGH (ref 65–99)
Glucose-Capillary: 272 mg/dL — ABNORMAL HIGH (ref 65–99)

## 2016-08-10 MED ORDER — TRAZODONE HCL 50 MG PO TABS: 50.0000 mg | ORAL_TABLET | Freq: Every evening | ORAL | 0 refills | Status: AC | PRN

## 2016-08-10 MED ORDER — CARIPRAZINE HCL 1.5 MG PO CAPS: 3.0000 mg | ORAL_CAPSULE | Freq: Every day | ORAL | 0 refills | Status: AC

## 2016-08-10 MED ORDER — METFORMIN HCL 1000 MG PO TABS: 1000.0000 mg | ORAL_TABLET | Freq: Two times a day (BID) | ORAL | Status: AC

## 2016-08-10 MED ORDER — BUPROPION HCL ER (XL) 150 MG PO TB24: 150.0000 mg | ORAL_TABLET | Freq: Every day | ORAL | 0 refills | Status: AC

## 2016-08-10 MED ORDER — METOPROLOL SUCCINATE ER 100 MG PO TB24: 100.0000 mg | ORAL_TABLET | Freq: Two times a day (BID) | ORAL | Status: AC

## 2016-08-10 MED ORDER — HYDROXYZINE HCL 50 MG PO TABS: 50.0000 mg | ORAL_TABLET | Freq: Four times a day (QID) | ORAL | 0 refills | Status: AC | PRN

## 2016-08-10 MED ORDER — LOSARTAN POTASSIUM 100 MG PO TABS: 100.0000 mg | ORAL_TABLET | Freq: Every day | ORAL | Status: AC

## 2016-08-10 MED ORDER — FLUOXETINE HCL 20 MG PO CAPS: 20.0000 mg | ORAL_CAPSULE | Freq: Every day | ORAL | 0 refills | Status: AC

## 2016-08-10 NOTE — [facility] Suicide Risk Assessment (Addendum)
[facility]   Principal Problem: MDD (major depressive disorder), recurrent severe, without psychosis (HCC) Discharge Diagnoses:  Patient Active Problem List   Diagnosis Date Noted  . MDD (major depressive disorder), recurrent severe, without psychosis (HCC) [F33.2] 08/06/2016  . Migraine with aura [G43.109] 09/08/2013  . OSA (obstructive sleep apnea) [G47.33] 09/08/2013    Total Time spent with patient: 30 minutes  Musculoskeletal: Strength & Muscle Tone: within normal limits Gait & Station: normal Patient leans: N/A  Psychiatric Specialty Exam: ROS no chest pain, no shortness of breath, no vomiting   Blood pressure 130/88, pulse 80, temperature 98.6 F (37 C), temperature source Oral, resp. rate (!) 22, height 6\' 1"  (1.854 m), weight (!) 322 lb (146.1 kg).Body mass index is 42.48 kg/m.  General Appearance: Well Groomed  Eye Contact::  Good  Speech:  Normal Rate409  Volume:  Normal  Mood:  improved, and currently he denies depression   Affect:  Appropriate and Full Range  Thought Process:  Linear  Orientation:  Full (Time, Place, and Person)  Thought Content:  denies hallucinations, no delusions, not internally preoccupied   Suicidal Thoughts:  No denies any suicidal ideations, denies any self injurious ideations   Homicidal Thoughts:  No denies any homicidal or violent ideations   Memory:  recent and remote grossly intact   Judgement:  Other:  improving   Insight:  improving   Psychomotor Activity:  Normal  Concentration:  Good  Recall:  Good  Fund of Knowledge:Good  Language: Good  Akathisia:  Negative  Handed:  Right  AIMS (if indicated):     Assets:  Desire for Improvement Resilience  Sleep:  Number of Hours: 5.25  Cognition: WNL  ADL's:  Intact   Mental Status Per Nursing Assessment::   On Admission:     Demographic Factors:  40 year old married male, employed   Loss Factors: Psychosocial stressors    Historical  Factors: History of depression , one prior psychiatric admission for depression   Risk Reduction Factors:   Sense of responsibility to family, Living with another person, especially a relative and Positive coping skills or problem solving skills  Continued Clinical Symptoms:  At this time patient is alert, attentive, well related, well groomed, mood improved, and currently minimizes depression, presents with full range of affect, no thought disorder, denies suicidal or self injurious ideations, no hallucinations, no delusions, future oriented. Denies medication side effects. States he feels better about himself at this time, spoke about weight loss efforts in order to improve overall physical health . We have reviewed lab findings- elevated LFTs ( Heb/ C workup negative ) , hyperlipidemia.  Patient plans to follow up with his PCP for outpatient management .  Cognitive Features That Contribute To Risk:  No gross cognitive deficits noted upon discharge. Is alert , attentive, and oriented x 3   Suicide Risk:  Mild:  Suicidal ideation of limited frequency, intensity, duration, and specificity.  There are no identifiable plans, no associated intent, mild dysphoria and related symptoms, good self-control (both objective and subjective assessment), few other risk factors, and identifiable protective factors, including available and accessible social support.  Follow-up Information    [facility] .   Why:  9/13 at 9:30am with therapist and at 1:00pm with Dr. Jennelle Humanottle for medication management. Contact information: [address], [place], [place] [address] Phone: [phone]           Plan Of Care/Follow-up recommendations:  Activity:  as tolerated  Diet:  Heart Healthy, Diabetic Diet  Tests:  NA Other:  See below   Patient is leaving in good spirits Plans to return home Plans to follow up with Dr. Marc Morgans and with his outpatient therapist for ongoing  psychiatric care  Plans to follow up with his PCP, Dr. Eliseo Gum , for medical management as needed, to include following up on elevated LFTs   COBOS, Madaline Guthrie, MD 08/10/2016, 11:45 AM

## 2016-08-10 NOTE — Discharge Summary (Signed)
Physician Discharge Summary Note  Patient:  Johnathan Steele is an 40 y.o., male MRN:  [id number] DOB:  12-18-1975 Patient phone:  [phone] (home)  Patient address:   [address] [place] [address],   Total Time spent with patient: Greater than 30 minutes  Date of Admission:  08/06/2016  Date of Discharge: 08-10-16  Reason for Admission: Worsening symptoms of depression triggering suicidal ideations.  Principal Problem: MDD (major depressive disorder), recurrent severe, without psychosis [facility])  Discharge Diagnoses: Patient Active Problem List   Diagnosis Date Noted  . MDD (major depressive disorder), recurrent severe, without psychosis (HCC) [F33.2] 08/06/2016  . Migraine with aura [G43.109] 09/08/2013  . OSA (obstructive sleep apnea) [G47.33] 09/08/2013   Past Psychiatric History: Major depression.  Past Medical History:  Past Medical History:  Diagnosis Date  . Depression   . Diabetes mellitus   . Hypertension   . Migraine   . Migraine with aura 09/08/2013  . Migraines   . OSA (obstructive sleep apnea) 09/08/2013    Past Surgical History:  Procedure Laterality Date  . SHOULDER SURGERY    . SKIN BIOPSY     Family History:  Family History  Problem Relation Age of Onset  . Mental illness Neg Hx    Family Psychiatric  History: See H&P  Social History:  History  Alcohol Use No     History  Drug Use No    Social History   Social History  . Marital status: Married    Spouse name: N/A  . Number of children: N/A  . Years of education: N/A   Social History Main Topics  . Smoking status: Never Smoker  . Smokeless tobacco: Never Used  . Alcohol use No  . Drug use: No  . Sexual activity: Not Asked   Other Topics Concern  . None   Social History Narrative  . None   Hospital Course: Pt endorses suicidal plan and states he thought about staging a robbery and having someone kill him. Pt states this scared him because although he has thought  about suicide in the past he has never actually thought of a way to do it so he immediately came to the hospital. Pt denies any SA and reports his triggers are "just stress." Pt reports a desire to stay in bed all day and reports he finds it difficult to find the energy to do anything including tending to personal hygiene, eating, and spending time with his family. Pt endorses multiple depressive symptoms including isolating, insomnia, restlessness, poor appetite, hopelessness, crying spells.  Johnathan Steele was admitted to the hospital for worsening symptoms of depression & crisis management due to suicidal ideations. Although, without hx of suicide attempts, Johnathan Steele was trying to figure out a way to stage a robbery so that someone will kill him in the process. He was in need of mood stabilization treatments.  After his admission assessment, his presenting symptoms were identified. The medication regimen targeting those symptoms were initiated. He was medicated & discharged on; Wellbutrin XL 150 mg for depression, Cariprazin 30 mg for mood control, Fluoxetine 20 mg for depression, Hydroxyzine 50 mg for anxiety & Trazodone 50 mg insomnia. His other pre-existing medical problems were identified & treated by resuming his pertinent home medications for those health issues. Johnathan Steele was enrolled & participated in the group counseling sessions being offered & held on this unit. He learned coping skills..  During the course of his hospitalization, Johnathan Steele's improvement was monitored by observation &  his daily report of symptom reduction noted. His emotional and mental status were monitored by daily self-inventory reports completed by Johnathan Steele & the clinical staff. He was evaluated by the treatment team for stability and plans for continued recovery after discharge. Johnathan Steele's motivation was an integral factor in his mood stability. He was offered further treatment options upon discharge to continue  mental health care on outpatient basis as noted below.   Upon discharge, Johnathan Steele was both mentally and medically stable. He denies suicidal/homicidal ideations, auditory/visual/tactile hallucinations, delusional thoughts or paranoia.  [facility] in no distress. Transportation per wife.  Physical Findings: AIMS: Facial and Oral Movements Muscles of Facial Expression: None, normal Lips and Perioral Area: None, normal Jaw: None, normal Tongue: None, normal,Extremity Movements Upper (arms, wrists, hands, fingers): None, normal Lower (legs, knees, ankles, toes): None, normal, Trunk Movements Neck, shoulders, hips: None, normal, Overall Severity Severity of abnormal movements (highest score from questions above): None, normal Incapacitation due to abnormal movements: None, normal Patient's awareness of abnormal movements (rate only patient's report): No Awareness, Dental Status Current problems with teeth and/or dentures?: No Does patient usually wear dentures?: No  CIWA:    COWS:     Musculoskeletal: Strength & Muscle Tone: within normal limits Gait & Station: normal Patient leans: N/A  Psychiatric Specialty Exam: Physical Exam  Constitutional: He is oriented to person, place, and time. He appears well-developed.  HENT:  Head: Normocephalic.  Eyes: Pupils are equal, round, and reactive to light.  Neck: Normal range of motion.  Cardiovascular: Normal rate.   Respiratory: Effort normal.  Genitourinary:  Genitourinary Comments: Denies any issues in this area.  Musculoskeletal: Normal range of motion.  Neurological: He is alert and oriented to person, place, and time.  Skin: Skin is warm and dry.    Review of Systems  Constitutional: Negative.   HENT: Negative.   Eyes: Negative.   Respiratory: Negative.   Cardiovascular: Negative.   Gastrointestinal: Negative.   Genitourinary: Negative.   Musculoskeletal: Negative.   Skin: Negative.   Neurological:  Negative.   Endo/Heme/Allergies: Negative.   Psychiatric/Behavioral: Positive for depression (Stable). Negative for hallucinations, memory loss, substance abuse and suicidal ideas. The patient has insomnia (Stable). The patient is not nervous/anxious.     Blood pressure 130/88, pulse 80, temperature 98.6 F (37 C), temperature source Oral, resp. rate (!) 22, height 6\' 1"  (1.854 m), weight (!) 146.1 kg (322 lb).Body mass index is 42.48 kg/m.  See Md's SRA   Have you used any form of tobacco in the last 30 days? (Cigarettes, Smokeless Tobacco, Cigars, and/or Pipes): No  Has this patient used any form of tobacco in the last 30 days? (Cigarettes, Smokeless Tobacco, Cigars, and/or Pipes):  No  Blood Alcohol level:  Lab Results  Component Value Date   ETH <5 08/05/2016   Metabolic Disorder Labs:  Lab Results  Component Value Date   HGBA1C 7.5 (H) 08/06/2016   MPG 169 08/06/2016   Lab Results  Component Value Date   PROLACTIN 9.3 08/07/2016   Lab Results  Component Value Date   CHOL 242 (H) 08/06/2016   TRIG 346 (H) 08/06/2016   HDL 42 08/06/2016   CHOLHDL 5.8 08/06/2016   VLDL 69 (H) 08/06/2016   LDLCALC 131 (H) 08/06/2016   See Psychiatric Specialty Exam and Suicide Risk Assessment completed by Attending Physician prior to discharge.  Discharge destination:  Home  Is patient on multiple antipsychotic therapies at discharge:  No   Has  Patient had three or more failed trials of antipsychotic monotherapy by history:  No  Recommended Plan for Multiple Antipsychotic Therapies: NA    Medication List    STOP taking these medications   modafinil 200 MG tablet Commonly known as:  PROVIGIL   sildenafil 20 MG tablet Commonly known as:  REVATIO   venlafaxine XR 150 MG 24 hr capsule Commonly known as:  EFFEXOR-XR     TAKE these medications     Indication  buPROPion 150 MG 24 hr tablet Commonly known as:  WELLBUTRIN XL Take 1 tablet (150 mg total) by mouth daily. For  depression What changed:  additional instructions  Indication:  Major Depressive Disorder   Cariprazine HCl 1.5 MG Caps Commonly known as:  VRAYLAR Take 2 capsules (3 mg total) by mouth daily. For mood control What changed:  additional instructions  Indication:  Mood control   FLUoxetine 20 MG capsule Commonly known as:  PROZAC Take 1 capsule (20 mg total) by mouth daily. For depression  Indication:  Major Depressive Disorder   hydrOXYzine 50 MG tablet Commonly known as:  ATARAX/VISTARIL Take 1 tablet (50 mg total) by mouth every 6 (six) hours as needed for anxiety.  Indication:  Anxiety   losartan 100 MG tablet Commonly known as:  COZAAR Take 1 tablet (100 mg total) by mouth daily. For high blood pressure What changed:  additional instructions  Indication:  High Blood Pressure Disorder   metFORMIN 1000 MG tablet Commonly known as:  GLUCOPHAGE Take 1 tablet (1,000 mg total) by mouth 2 (two) times daily with a meal. For diabetes What changed:  additional instructions  Indication:  Type 2 Diabetes   metoprolol succinate 100 MG 24 hr tablet Commonly known as:  TOPROL-XL Take 1 tablet (100 mg total) by mouth every 12 (twelve) hours. For high blood pressure What changed:  additional instructions  Indication:  High Blood Pressure Disorder   traZODone 50 MG tablet Commonly known as:  DESYREL Take 1 tablet (50 mg total) by mouth at bedtime as needed for sleep.  Indication:  Trouble Sleeping      Follow-up Information    [facility] .   Why:  9/13 at 9:30am with therapist and at 1:00pm with Dr. Jennelle Human for medication management. Contact information: [address], [place], [place] [address] Phone: [phone]          Follow-up recommendations: Activity:  As tolerated Diet: As recommended by your primary care doctor. Keep all scheduled follow-up appointments as recommended.    Comments: Patient is instructed prior to discharge to:  Take all medications as prescribed by his/her mental healthcare provider. Report any adverse effects and or reactions from the medicines to his/her outpatient provider promptly. Patient has been instructed & cautioned: To not engage in alcohol and or illegal drug use while on prescription medicines. In the event of worsening symptoms, patient is instructed to call the crisis hotline, 911 and or go to the nearest ED for appropriate evaluation and treatment of symptoms. To follow-up with his/her primary care provider for your other medical issues, concerns and or health care needs.   Signed: Sanjuana Kava, NP, PMHNP, FNP-BC 08/10/2016, 10:36 AM   Patient seen, Suicide Assessment Completed.  Disposition Plan Reviewed

## 2016-08-10 NOTE — Progress Notes (Addendum)
Patient ID: Johnathan Steele, male   DOB: 03/02/1976, 40 y.o.   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 He is given MD SRA, transition record and dc AVS. ALl info reviewed with him and he states compliance . He completes his daily assessment and writes on it he rates his depression, hopelessness and anxiety " 0/0/0/", respectively.cc of suicide safety plan geiven pt also. He denies having SI today. He says he is " ready " to be discharged and " its time". All belongings returned to him and he is escorted to the door.

## 2016-08-10 NOTE — Progress Notes (Signed)
D: Pt plans to continue with treatment after discharge. Stated he will continue to see his therapist, and take his medications. Pt was brighter Thursday than previous day. Stated he enjoyed Retail bankerkaraoke and decided to sing. Pt has no questions or concerns.    A:  Support and encouragement was offered. 15 min checks continued for safety.  R: Pt remains safe.

## 2016-08-10 NOTE — Progress Notes (Addendum)
Recreation Therapy Notes  Date: 08/10/16 Time: 0930 Location: 300 [address] Dayroom  Group Topic: Stress Management  Goal Area(s) Addresses:  Patient will verbalize importance of using healthy stress management.  Patient will identify positive emotions associated with healthy stress management.   Intervention: Stress Management  Activity :  Guided Imagery.  LRT introduced patients to the technique of guided imagery.  LRT read script to allow patients to participate in the technique.  Patients were to listen and follow along as LRT read script.  Education:  Stress Management, Discharge Planning.   Education Outcome: Acknowledges edcuation/In group clarification offered/Needs additional education  Clinical Observations/Feedback:  Pt did not attend group.   Marjette Lindsay, LRT/CTRS         Lindsay, Marjette A 08/10/2016 11:58 AM 

## 2016-08-10 NOTE — Tx Team (Signed)
Interdisciplinary Treatment and Diagnostic Plan Update  08/10/2016 Time of Session: 9:24 AM  Johnathan Steele MRN: [id number]  Principal Diagnosis: MDD (major depressive disorder), recurrent severe, without psychosis ([place])  Secondary Diagnoses: Principal Problem:   MDD (major depressive disorder), recurrent severe, without psychosis ([place])   Current Medications:  Current Facility-Administered Medications  Medication Dose Route Frequency Provider Last Rate Last Dose  . acetaminophen (TYLENOL) tablet 650 mg  650 mg Oral Q6H PRN Lurena Nida, NP      . buPROPion (WELLBUTRIN XL) 24 hr tablet 150 mg  150 mg Oral Daily Lurena Nida, NP   150 mg at 08/09/16 4010  . Cariprazine HCl CAPS 3 mg  3 mg Oral Daily Jenne Campus, MD   3 mg at 08/09/16 0813  . FLUoxetine (PROZAC) capsule 20 mg  20 mg Oral Daily Jenne Campus, MD   20 mg at 08/09/16 0813  . hydrOXYzine (ATARAX/VISTARIL) tablet 50 mg  50 mg Oral Q6H PRN Laverle Hobby, PA-C      . insulin aspart (novoLOG) injection 0-15 Units  0-15 Units Subcutaneous TID WC Kerrie Buffalo, NP   8 Units at 08/10/16 [phone]  . insulin aspart (novoLOG) injection 0-5 Units  0-5 Units Subcutaneous QHS Kerrie Buffalo, NP   3 Units at 08/09/16 2255  . losartan (COZAAR) tablet 100 mg  100 mg Oral Daily Lurena Nida, NP   100 mg at 08/09/16 3664  . metFORMIN (GLUCOPHAGE) tablet 1,000 mg  1,000 mg Oral BID WC Lurena Nida, NP   1,000 mg at 08/09/16 1706  . metoprolol succinate (TOPROL-XL) 24 hr tablet 100 mg  100 mg Oral Q12H Lurena Nida, NP   100 mg at 08/09/16 2015  . traZODone (DESYREL) tablet 50 mg  50 mg Oral QHS PRN Jenne Campus, MD   50 mg at 08/09/16 2254    PTA Medications: Prescriptions Prior to Admission  Medication Sig Dispense Refill Last Dose  . buPROPion (WELLBUTRIN XL) 150 MG 24 hr tablet Take 150 mg by mouth daily.   08/05/2016 at Unknown time  . Cariprazine HCl (VRAYLAR) 1.5 MG CAPS Take 3 mg by mouth daily.   08/05/2016 at Unknown  time  . losartan (COZAAR) 100 MG tablet Take 100 mg by mouth daily.    08/05/2016 at Unknown time  . metFORMIN (GLUCOPHAGE) 1000 MG tablet Take 1,000 mg by mouth 2 (two) times daily with a meal.   08/05/2016 at am  . metoprolol succinate (TOPROL-XL) 100 MG 24 hr tablet Take 100 mg by mouth every 12 (twelve) hours.    08/04/2016 at 1900  . modafinil (PROVIGIL) 200 MG tablet Take 200 mg by mouth daily at 6 PM.   08/04/2016 at Unknown time  . sildenafil (REVATIO) 20 MG tablet Take 20 mg by mouth daily as needed (erectile dysfunction).   couple months ago  . venlafaxine XR (EFFEXOR-XR) 150 MG 24 hr capsule Take 150 mg by mouth 2 (two) times daily.   08/05/2016 at am    Treatment Modalities: Medication Management, Group therapy, Case management,  1 to 1 session with clinician, Psychoeducation, Recreational therapy.   Physician Treatment Plan for Primary Diagnosis: MDD (major depressive disorder), recurrent severe, without psychosis ([place]) Long Term Goal(s): Improvement in symptoms so as ready for discharge  Short Term Goals: Ability to identify changes in lifestyle to reduce recurrence of condition will improve, Ability to verbalize feelings will improve, Ability to identify and develop effective coping behaviors will  improve and Compliance with prescribed medications will improve  Medication Management: Evaluate patient's response, side effects, and tolerance of medication regimen.  Therapeutic Interventions: 1 to 1 sessions, Unit Group sessions and Medication administration.  Evaluation of Outcomes: Met  Physician Treatment Plan for Secondary Diagnosis: Principal Problem:   MDD (major depressive disorder), recurrent severe, without psychosis ([place])   Long Term Goal(s): Improvement in symptoms so as ready for discharge  Short Term Goals: Ability to identify changes in lifestyle to reduce recurrence of condition will improve, Ability to verbalize feelings will improve and Ability to maintain clinical  measurements within normal limits will improve  Medication Management: Evaluate patient's response, side effects, and tolerance of medication regimen.  Therapeutic Interventions: 1 to 1 sessions, Unit Group sessions and Medication administration.  Evaluation of Outcomes: Met   RN Treatment Plan for Primary Diagnosis: MDD (major depressive disorder), recurrent severe, without psychosis ([place]) Long Term Goal(s): Knowledge of disease and therapeutic regimen to maintain health will improve  Short Term Goals: Ability to demonstrate self-control, Ability to disclose and discuss suicidal ideas and Ability to identify and develop effective coping behaviors will improve  Medication Management: RN will administer medications as ordered by provider, will assess and evaluate patient's response and provide education to patient for prescribed medication. RN will report any adverse and/or side effects to prescribing provider.  Therapeutic Interventions: 1 on 1 counseling sessions, Psychoeducation, Medication administration, Evaluate responses to treatment, Monitor vital signs and CBGs as ordered, Perform/monitor CIWA, COWS, AIMS and Fall Risk screenings as ordered, Perform wound care treatments as ordered.  Evaluation of Outcomes: Met   LCSW Treatment Plan for Primary Diagnosis: MDD (major depressive disorder), recurrent severe, without psychosis ([place]) Long Term Goal(s): Safe transition to appropriate next level of care at discharge, Engage patient in therapeutic group addressing interpersonal concerns.  Short Term Goals: Engage patient in aftercare planning with referrals and resources, Identify triggers associated with mental health/substance abuse issues and Increase skills for wellness and recovery  Therapeutic Interventions: Assess for all discharge needs, 1 to 1 time with Social worker, Explore available resources and support systems, Assess for adequacy in community support network, Educate family  and significant other(s) on suicide prevention, Complete Psychosocial Assessment, Interpersonal group therapy.  Evaluation of Outcomes: Met   Progress in Treatment: Attending groups: Yes Participating in groups: Yes Taking medication as prescribed: Yes, MD continues to assess for medication changes as needed Toleration medication: Yes, no side effects reported at this time Family/Significant other contact made: Yes with wife Patient understands diagnosis: Yes AEB seeking help with depression Discussing patient identified problems/goals with staff: Yes Medical problems stabilized or resolved: Yes Denies suicidal/homicidal ideation:  Yes Issues/concerns per patient self-inventory: None Other: N/A  New problem(s) identified: None identified at this time.   New Short Term/Long Term Goal(s): None identified at this time.   Discharge Plan or Barriers: Pt will return home and follow-up with outpatient services.   Reason for Continuation of Hospitalization: Anxiety Depression Medication stabilization Suicidal ideation  Estimated Length of Stay: 0 days  Attendees: Patient: 08/10/2016  9:24 AM  Physician: Dr. Parke Poisson 08/10/2016  9:24 AM  Nursing: Vira Browns, RN; Leanne Lovely, RN 08/10/2016  9:24 AM  RN Care Manager: Lars Pinks, RN 08/10/2016  9:24 AM  Social Worker: Peri Maris, LCSW;  08/10/2016  9:24 AM  Recreational Therapist:  08/10/2016  9:24 AM  Other: Lindell Spar, NP; Samuel Jester, NP 08/10/2016  9:24 AM  Other:  08/10/2016  9:24 AM  Other: 08/10/2016  9:24 AM    Scribe for Treatment Team: Bo Mcclintock, LCSW 08/10/2016 9:24 AM

## 2016-08-10 NOTE — Progress Notes (Signed)
  [facility] Discharge [facility] :  Will you be returning to the same living situation after discharge:  Yes,  Pt returning home  At discharge, do you have transportation home?: Yes,  Pt wife to pick up Do you have the ability to pay for your medications: Yes,  Pt provided with prescriptions  Release of information consent forms completed and in the chart;  Patient's signature needed at discharge.  Patient to Follow up at: Follow-up Information    [facility] .   Why:  9/13 at 9:30am with therapist and at 1:00pm with Dr. Jennelle Humanottle for medication management. Contact information: [address], [place], [place] [address] Phone: [phone]           Next level of care provider has access to [facility] Link:no  Safety Planning and Suicide Prevention discussed: Yes,  with wife; see SPE note  Have you used any form of tobacco in the last 30 days? (Cigarettes, Smokeless Tobacco, Cigars, and/or Pipes): No  Has patient been referred to the Quitline?: N/A patient is not a smoker  Patient has been referred for addiction treatment: N/A  Elaina HoopsLauren M Carter 08/10/2016, 9:48 AM

## 2016-08-19 ENCOUNTER — Encounter (HOSPITAL_BASED_OUTPATIENT_CLINIC_OR_DEPARTMENT_OTHER): Payer: Self-pay | Admitting: Emergency Medicine

## 2016-08-19 ENCOUNTER — Emergency Department (HOSPITAL_BASED_OUTPATIENT_CLINIC_OR_DEPARTMENT_OTHER)
Admission: EM | Admit: 2016-08-19 | Discharge: 2016-08-19 | Disposition: A | Discharge status: Home / Self Care | Payer: Managed Care, Other (non HMO) | Attending: Emergency Medicine | Admitting: Emergency Medicine

## 2016-08-19 DIAGNOSIS — R739 Hyperglycemia, unspecified: Secondary | ICD-10-CM

## 2016-08-19 DIAGNOSIS — Z7984 Long term (current) use of oral hypoglycemic drugs: Secondary | ICD-10-CM | POA: Insufficient documentation

## 2016-08-19 DIAGNOSIS — E1165 Type 2 diabetes mellitus with hyperglycemia: Secondary | ICD-10-CM | POA: Insufficient documentation

## 2016-08-19 DIAGNOSIS — I10 Essential (primary) hypertension: Secondary | ICD-10-CM | POA: Insufficient documentation

## 2016-08-19 LAB — URINALYSIS, ROUTINE W REFLEX MICROSCOPIC
Bilirubin Urine: NEGATIVE
Glucose, UA: >1000 mg/dL — AB
Ketones, ur: NEGATIVE mg/dL
Leukocytes, UA: NEGATIVE
Nitrite: NEGATIVE
Protein, ur: 30 mg/dL — AB
Specific Gravity, Urine: 1.038 — ABNORMAL HIGH (ref 1.005–1.030)
pH: 5 (ref 5.0–8.0)

## 2016-08-19 LAB — BASIC METABOLIC PANEL
Anion gap: 13 (ref 5–15)
BUN: 15 mg/dL (ref 6–20)
CO2: 23 mmol/L (ref 22–32)
Calcium: 9.1 mg/dL (ref 8.9–10.3)
Chloride: 97 mmol/L — ABNORMAL LOW (ref 101–111)
Creatinine, Ser: 1.03 mg/dL (ref 0.61–1.24)
GFR calc Af Amer: >60 mL/min (ref >60)
GFR calc non Af Amer: >60 mL/min (ref >60)
Glucose, Bld: 473 mg/dL — ABNORMAL HIGH (ref 65–99)
Potassium: 4.2 mmol/L (ref 3.5–5.1)
Sodium: 133 mmol/L — ABNORMAL LOW (ref 135–145)

## 2016-08-19 LAB — URINE MICROSCOPIC-ADD ON

## 2016-08-19 LAB — CBC WITH DIFFERENTIAL/PLATELET
Basophils Absolute: 0.1 10*3/uL (ref 0.0–0.1)
Basophils Relative: 1 %
Eosinophils Absolute: 0.1 10*3/uL (ref 0.0–0.7)
Eosinophils Relative: 1 %
HCT: 42.5 % (ref 39.0–52.0)
Hemoglobin: 14.7 g/dL (ref 13.0–17.0)
Lymphocytes Relative: 20 %
Lymphs Abs: 1.3 10*3/uL (ref 0.7–4.0)
MCH: 28.5 pg (ref 26.0–34.0)
MCHC: 34.6 g/dL (ref 30.0–36.0)
MCV: 82.4 fL (ref 78.0–100.0)
Monocytes Absolute: 0.4 10*3/uL (ref 0.1–1.0)
Monocytes Relative: 6 %
Neutro Abs: 4.7 10*3/uL (ref 1.7–7.7)
Neutrophils Relative %: 72 %
Platelets: 267 10*3/uL (ref 150–400)
RBC: 5.16 MIL/uL (ref 4.22–5.81)
RDW: 12.8 % (ref 11.5–15.5)
WBC: 6.6 10*3/uL (ref 4.0–10.5)

## 2016-08-19 LAB — CBG MONITORING, ED
Glucose-Capillary: 464 mg/dL — ABNORMAL HIGH (ref 65–99)
Glucose-Capillary: 337 mg/dL — ABNORMAL HIGH (ref 65–99)

## 2016-08-19 MED ORDER — INSULIN REGULAR HUMAN 100 UNIT/ML IJ SOLN
15.0000 [IU] | Freq: Once | INTRAMUSCULAR | Status: AC
  Administered 2016-08-19: 15 [IU] via INTRAVENOUS
  Filled 2016-08-19: qty 1

## 2016-08-19 MED ORDER — SODIUM CHLORIDE 0.9 % IV BOLUS (SEPSIS)
2000.0000 mL | Freq: Once | INTRAVENOUS | Status: AC
  Administered 2016-08-19: 1000 mL via INTRAVENOUS

## 2016-08-19 MED ORDER — GLIPIZIDE ER 10 MG PO TB24: 10.0000 mg | ORAL_TABLET | Freq: Every day | ORAL | 0 refills | Status: AC

## 2016-08-19 NOTE — ED Provider Notes (Signed)
[facility] Provider Note   CSN: [id number] Arrival date & time: 08/19/16  1656 By signing my name below, I, Levon HedgerElizabeth Hall, attest that this documentation has been prepared under the direction and in the presence of non-physician practitioner, Noelle PennerSerena Sam, PA-C  Electronically Signed: Levon HedgerElizabeth Hall, Scribe. 08/19/2016. 5:25 PM.   History   Chief Complaint Chief Complaint  Patient presents with  . Hyperglycemia   HPI Johnathan Steele is a 40 y.o. male who presents to the Emergency Department complaining of  Blood sugar at home has been in the high 200's for one week. He states he has been taking metformin but has run out of his new diabetes medication. He is followed by PCP Bernadette Hoitafaela Aguiar, but has not contacted him about refilling his medication. He has not taken his metformin today. Pt notes associated fatigue, dry mouth, and thirst. He denies abdominal pain, nausea, vomiting, fever, chills, cough, or polyuria.  On EMR review, pt is supposed to be taking glipizide 10mg  ER tablet 1 tab PO daily.  The history is provided by the patient. No language interpreter was used.   Past Medical History:  Diagnosis Date  . Depression   . Diabetes mellitus   . Hypertension   . Migraine   . Migraine with aura 09/08/2013  . Migraines   . OSA (obstructive sleep apnea) 09/08/2013    Patient Active Problem List   Diagnosis Date Noted  . 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 Migraine with aura 09/08/2013  . OSA (obstructive sleep apnea) 09/08/2013   Past Surgical History:  Procedure Laterality Date  . SHOULDER SURGERY    . SKIN BIOPSY       Home Medications    Prior to Admission medications   Medication Sig Start Date End Date Taking? Authorizing Provider  buPROPion (WELLBUTRIN XL) 150 MG 24 hr tablet Take 1 tablet (150 mg total) by mouth daily. For depression 08/10/16   Sanjuana KavaAgnes I Nwoko, NP  Cariprazine HCl (VRAYLAR) 1.5 MG  CAPS Take 2 capsules (3 mg total) by mouth daily. For mood control 08/10/16   Sanjuana KavaAgnes I Nwoko, NP  FLUoxetine (PROZAC) 20 MG capsule Take 1 capsule (20 mg total) by mouth daily. For depression 08/10/16   Sanjuana KavaAgnes I Nwoko, NP  hydrOXYzine (ATARAX/VISTARIL) 50 MG tablet Take 1 tablet (50 mg total) by mouth every 6 (six) hours as needed for anxiety. 08/10/16   Sanjuana KavaAgnes I Nwoko, NP  losartan (COZAAR) 100 MG tablet Take 1 tablet (100 mg total) by mouth daily. For high blood pressure 08/10/16   Sanjuana KavaAgnes I Nwoko, NP  metFORMIN (GLUCOPHAGE) 1000 MG tablet Take 1 tablet (1,000 mg total) by mouth 2 (two) times daily with a meal. For diabetes 08/10/16   Sanjuana KavaAgnes I Nwoko, NP  metoprolol succinate (TOPROL-XL) 100 MG 24 hr tablet Take 1 tablet (100 mg total) by mouth every 12 (twelve) hours. For high blood pressure 08/10/16   Sanjuana KavaAgnes I Nwoko, NP  traZODone (DESYREL) 50 MG tablet Take 1 tablet (50 mg total) by mouth at bedtime as needed for sleep. 08/10/16   Sanjuana KavaAgnes I Nwoko, NP    Family History Family History  Problem Relation Age of Onset  . Mental illness Neg Hx     Social History Social History  Substance Use Topics  . Smoking status: Never Smoker  . Smokeless tobacco: Never Used  . Alcohol use No     Allergies   Sumatriptan succinate and Topamax [topiramate]  Review of  Systems Review of Systems  Constitutional: Positive for fatigue. Negative for chills and fever.  HENT:       Positive for dry mouth and increased thirst  Respiratory: Negative for cough.   Gastrointestinal: Negative for abdominal pain, nausea and vomiting.  Genitourinary:       Negative for polyuria  All other systems reviewed and are negative.   Physical Exam Updated Vital Signs BP (!) 149/102 (BP Location: Right Arm)   Pulse 98   Temp 98.6 F (37 C) (Oral)   Resp 18   Ht 6\' 1"  (1.854 m)   Wt (!) 323 lb (146.5 kg)   SpO2 97%   BMI 42.61 kg/m   Physical Exam  Constitutional: He is oriented to person, place, and time.  HENT:  Right Ear:  External ear normal.  Left Ear: External ear normal.  Nose: Nose normal.  Mouth/Throat: Oropharynx is clear and moist. No oropharyngeal exudate.  Eyes: Conjunctivae are normal.  Neck: Neck supple.  Cardiovascular: Normal rate, regular rhythm, normal heart sounds and intact distal pulses.   Pulmonary/Chest: Effort normal and breath sounds normal. No respiratory distress. He has no wheezes.  Abdominal: Soft. Bowel sounds are normal. He exhibits no distension. There is no tenderness. There is no rebound and no guarding.  Musculoskeletal: He exhibits no edema.  Lymphadenopathy:    He has no cervical adenopathy.  Neurological: He is alert and oriented to person, place, and time. No cranial nerve deficit.  Skin: Skin is warm and dry.  Psychiatric: He has a normal mood and affect.  Nursing note and vitals reviewed.   ED Treatments / Results  DIAGNOSTIC STUDIES:  Oxygen Saturation is 97% on RA, normal by my interpretation.    COORDINATION OF CARE:  5:25 PM Discussed treatment plan which includes urinalysis, CBC, and BMP with pt at bedside and pt agreed to plan.  Labs (all labs ordered are listed, but only abnormal results are displayed) Labs Reviewed  BASIC METABOLIC PANEL - Abnormal; Notable for the following:       Result Value   Sodium 133 (*)    Chloride 97 (*)    Glucose, Bld 473 (*)    All other components within normal limits  URINALYSIS, ROUTINE W REFLEX MICROSCOPIC (NOT AT [facility]) - Abnormal; Notable for the following:    Specific Gravity, Urine 1.038 (*)    Glucose, UA >1000 (*)    Hgb urine dipstick TRACE (*)    Protein, ur 30 (*)    All other components within normal limits  URINE MICROSCOPIC-ADD ON - Abnormal; Notable for the following:    Squamous Epithelial / LPF 0-5 (*)    Bacteria, UA RARE (*)    All other components within normal limits  CBG MONITORING, ED - Abnormal; Notable for the following:    Glucose-Capillary 464 (*)    All other components within normal  limits  CBG MONITORING, ED - Abnormal; Notable for the following:    Glucose-Capillary 337 (*)    All other components within normal limits  CBC WITH DIFFERENTIAL/PLATELET    EKG  EKG Interpretation None       Radiology No results found.  Procedures Procedures (including critical care time)  Medications Ordered in ED Medications  sodium chloride 0.9 % bolus 2,000 mL (not administered)     Initial Impression / Assessment and Plan / ED Course  I have reviewed the triage vital signs and the nursing notes.  Pertinent labs & imaging results that were available  during my care of the patient were reviewed by me and considered in my medical decision making (see chart for details).  Clinical Course   Pt not in DKA. Hyperglycemic on arrival, given 2L NS bolus and dose of insulin. He has no focal exam findings and is nontoxic appearing. Feels improved after fluids. Will give rx for refill of his glipizide. Instructed close PCP follow up. ER return precautions given.  Final Clinical Impressions(s) / ED Diagnoses   Final diagnoses:  Hyperglycemia   I personally performed the services described in this documentation, which was scribed in my presence. The recorded information has been reviewed and is accurate.  New Prescriptions Discharge Medication List as of 08/19/2016  8:30 PM    START taking these medications   Details  glipiZIDE (GLUCOTROL XL) 10 MG 24 hr tablet Take 1 tablet (10 mg total) by mouth daily with breakfast., Starting Sun 08/19/2016, Print         Carlene Coria, PA-C 08/20/16 1427    Melene Plan, DO 08/22/16 1512

## 2016-08-19 NOTE — Discharge Instructions (Signed)
Take your metformin and glipizide as prescribed for your diabetes. Follow up with your primary care provider as soon as possible. Return to the ER for new or worsening symptoms.

## 2016-08-19 NOTE — ED Triage Notes (Addendum)
Patient reports checked blood glucose at home-532.  Reports "extreme fatigue".  Denies polyuria, polydipsia, blurred vision.  States taking metformin regularly but states he was put on another diabetes medication he ran out of and hasn't been taking as scheduled.

## 2016-10-08 ENCOUNTER — Other Ambulatory Visit (HOSPITAL_COMMUNITY): Payer: Self-pay | Admitting: Psychiatry

## 2016-12-09 ENCOUNTER — Other Ambulatory Visit (HOSPITAL_COMMUNITY): Payer: Self-pay | Admitting: Psychiatry

## 2018-01-28 ENCOUNTER — Other Ambulatory Visit: Payer: Self-pay

## 2018-01-28 ENCOUNTER — Encounter (HOSPITAL_BASED_OUTPATIENT_CLINIC_OR_DEPARTMENT_OTHER): Payer: Self-pay | Admitting: *Deleted

## 2018-01-28 ENCOUNTER — Emergency Department (HOSPITAL_BASED_OUTPATIENT_CLINIC_OR_DEPARTMENT_OTHER)
Admission: EM | Admit: 2018-01-28 | Discharge: 2018-01-28 | Disposition: A | Discharge status: Home / Self Care | Payer: 59 | Attending: Emergency Medicine | Admitting: Emergency Medicine

## 2018-01-28 ENCOUNTER — Emergency Department (HOSPITAL_BASED_OUTPATIENT_CLINIC_OR_DEPARTMENT_OTHER): Payer: 59

## 2018-01-28 DIAGNOSIS — J40 Bronchitis, not specified as acute or chronic: Secondary | ICD-10-CM | POA: Insufficient documentation

## 2018-01-28 DIAGNOSIS — E119 Type 2 diabetes mellitus without complications: Secondary | ICD-10-CM | POA: Insufficient documentation

## 2018-01-28 DIAGNOSIS — Z79899 Other long term (current) drug therapy: Secondary | ICD-10-CM | POA: Insufficient documentation

## 2018-01-28 DIAGNOSIS — I10 Essential (primary) hypertension: Secondary | ICD-10-CM | POA: Insufficient documentation

## 2018-01-28 DIAGNOSIS — Z7984 Long term (current) use of oral hypoglycemic drugs: Secondary | ICD-10-CM | POA: Insufficient documentation

## 2018-01-28 DIAGNOSIS — R0602 Shortness of breath: Secondary | ICD-10-CM | POA: Diagnosis present

## 2018-01-28 LAB — CBC WITH DIFFERENTIAL/PLATELET
Basophils Absolute: 0 10*3/uL (ref 0.0–0.1)
Basophils Relative: 0 %
Eosinophils Absolute: 0.1 10*3/uL (ref 0.0–0.7)
Eosinophils Relative: 1 %
HCT: 43.2 % (ref 39.0–52.0)
Hemoglobin: 14.5 g/dL (ref 13.0–17.0)
Lymphocytes Relative: 26 %
Lymphs Abs: 3 10*3/uL (ref 0.7–4.0)
MCH: 28.1 pg (ref 26.0–34.0)
MCHC: 33.6 g/dL (ref 30.0–36.0)
MCV: 83.7 fL (ref 78.0–100.0)
Monocytes Absolute: 0.7 10*3/uL (ref 0.1–1.0)
Monocytes Relative: 6 %
Neutro Abs: 7.7 10*3/uL (ref 1.7–7.7)
Neutrophils Relative %: 67 %
Platelets: 286 10*3/uL (ref 150–400)
RBC: 5.16 MIL/uL (ref 4.22–5.81)
RDW: 14 % (ref 11.5–15.5)
WBC: 11.6 10*3/uL — ABNORMAL HIGH (ref 4.0–10.5)

## 2018-01-28 LAB — TROPONIN I
Troponin I: <0.03 ng/mL (ref <0.03)
Troponin I: <0.03 ng/mL (ref <0.03)

## 2018-01-28 LAB — COMPREHENSIVE METABOLIC PANEL
ALT: 69 U/L — ABNORMAL HIGH (ref 17–63)
AST: 52 U/L — ABNORMAL HIGH (ref 15–41)
Albumin: 4.2 g/dL (ref 3.5–5.0)
Alkaline Phosphatase: 59 U/L (ref 38–126)
Anion gap: 11 (ref 5–15)
BUN: 9 mg/dL (ref 6–20)
CO2: 21 mmol/L — ABNORMAL LOW (ref 22–32)
Calcium: 9.2 mg/dL (ref 8.9–10.3)
Chloride: 103 mmol/L (ref 101–111)
Creatinine, Ser: 0.9 mg/dL (ref 0.61–1.24)
GFR calc Af Amer: >60 mL/min (ref >60)
GFR calc non Af Amer: >60 mL/min (ref >60)
Glucose, Bld: 160 mg/dL — ABNORMAL HIGH (ref 65–99)
Potassium: 3.6 mmol/L (ref 3.5–5.1)
Sodium: 135 mmol/L (ref 135–145)
Total Bilirubin: 0.6 mg/dL (ref 0.3–1.2)
Total Protein: 7.9 g/dL (ref 6.5–8.1)

## 2018-01-28 LAB — D-DIMER, QUANTITATIVE: D-Dimer, Quant: 0.29 ug/mL-FEU (ref 0.00–0.50)

## 2018-01-28 MED ORDER — KETOROLAC TROMETHAMINE 30 MG/ML IJ SOLN
30.0000 mg | Freq: Once | INTRAMUSCULAR | Status: AC
  Administered 2018-01-28: 30 mg via INTRAVENOUS
  Filled 2018-01-28: qty 1

## 2018-01-28 MED ORDER — ASPIRIN 325 MG PO TABS
325.0000 mg | ORAL_TABLET | Freq: Once | ORAL | Status: AC
  Administered 2018-01-28: 325 mg via ORAL
  Filled 2018-01-28: qty 1

## 2018-01-28 MED ORDER — AZITHROMYCIN 250 MG PO TABS: ORAL_TABLET | ORAL | 0 refills | Status: AC

## 2018-01-28 MED ORDER — KETOROLAC TROMETHAMINE 30 MG/ML IJ SOLN: 30.0000 mg | Freq: Once | INTRAMUSCULAR | Status: DC

## 2018-01-28 MED ORDER — ALBUTEROL SULFATE (2.5 MG/3ML) 0.083% IN NEBU
5.0000 mg | INHALATION_SOLUTION | Freq: Once | RESPIRATORY_TRACT | Status: AC
  Administered 2018-01-28: 5 mg via RESPIRATORY_TRACT
  Filled 2018-01-28: qty 6

## 2018-01-28 MED ORDER — IPRATROPIUM BROMIDE 0.02 % IN SOLN
0.5000 mg | Freq: Once | RESPIRATORY_TRACT | Status: AC
  Administered 2018-01-28: 0.5 mg via RESPIRATORY_TRACT
  Filled 2018-01-28: qty 2.5

## 2018-01-28 MED ORDER — ALBUTEROL SULFATE HFA 108 (90 BASE) MCG/ACT IN AERS
2.0000 | INHALATION_SPRAY | Freq: Once | RESPIRATORY_TRACT | Status: AC
  Administered 2018-01-28: 2 via RESPIRATORY_TRACT
  Filled 2018-01-28: qty 6.7

## 2018-01-28 NOTE — ED Provider Notes (Signed)
[facility] EMERGENCY DEPARTMENT Provider Note   CSN: [id number] Arrival date & time: 01/28/18  1206     History   Chief Complaint Chief Complaint  Patient presents with  . Shortness of Breath    HPI Johnathan Steele is a 42 y.o. male history diabetes, hypertension here presenting with shortness of breath, left-sided chest pain and arm pain.  Patient states that for the last 2 days, he has been having some low-grade temperature as well as some shortness of breath and left-sided chest pain.  Pain is worse when he moves around associated with some shortness of breath.  Sometimes the pain radiated down his left arm as well.  Patient denies any recent travel or leg swelling.  Patient denies any history of blood clots or cardiac stents.  Patient does have his history of obstructive sleep apnea. Multiple family members sick with flu like syndrome but he denies any fevers.   The history is provided by the patient.    Past Medical History:  Diagnosis Date  . Depression   . Diabetes mellitus   . Hypertension   . Migraine   . Migraine with aura 09/08/2013  . Migraines   . OSA (obstructive sleep apnea) 09/08/2013    Patient Active Problem List   Diagnosis Date Noted  . 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 Migraine with aura 09/08/2013  . OSA (obstructive sleep apnea) 09/08/2013    Past Surgical History:  Procedure Laterality Date  . SHOULDER SURGERY    . SKIN BIOPSY         Home Medications    Prior to Admission medications   Medication Sig Start Date End Date Taking? Authorizing Provider  AMLODIPINE BESYLATE PO Take by mouth.   Yes [provider]  FLUoxetine (PROZAC) 20 MG capsule Take 1 capsule (20 mg total) by mouth daily. For depression 08/10/16  Yes Armandina Stammer I, NP  glipiZIDE (GLUCOTROL XL) 10 MG 24 hr tablet Take 1 tablet (10 mg total) by mouth daily with breakfast. 08/19/16  Yes Sam, Serena Y,  PA-C  hydrOXYzine (ATARAX/VISTARIL) 50 MG tablet Take 1 tablet (50 mg total) by mouth every 6 (six) hours as needed for anxiety. 08/10/16  Yes Armandina Stammer I, NP  losartan (COZAAR) 100 MG tablet Take 1 tablet (100 mg total) by mouth daily. For high blood pressure 08/10/16  Yes Nwoko, Nicole Kindred I, NP  metFORMIN (GLUCOPHAGE) 1000 MG tablet Take 1 tablet (1,000 mg total) by mouth 2 (two) times daily with a meal. For diabetes 08/10/16  Yes Nwoko, Nicole Kindred I, NP  metoprolol succinate (TOPROL-XL) 100 MG 24 hr tablet Take 1 tablet (100 mg total) by mouth every 12 (twelve) hours. For high blood pressure 08/10/16  Yes Nwoko, Agnes I, NP  buPROPion (WELLBUTRIN XL) 150 MG 24 hr tablet Take 1 tablet (150 mg total) by mouth daily. For depression 08/10/16   Armandina Stammer I, NP  Cariprazine HCl (VRAYLAR) 1.5 MG CAPS Take 2 capsules (3 mg total) by mouth daily. For mood control 08/10/16   Armandina Stammer I, NP  traZODone (DESYREL) 50 MG tablet Take 1 tablet (50 mg total) by mouth at bedtime as needed for sleep. 08/10/16   Armandina Stammer I, NP  albuterol (PROVENTIL HFA;VENTOLIN HFA) 108 (90 Base) MCG/ACT inhaler Inhale 1-2 puffs into the lungs every 6 (six) hours as needed for wheezing or shortness of breath. 12/04/15 12/04/15  Barrett, Rolm Gala, PA-C    Family History Family History  Problem Relation Age of Onset  . Mental illness Neg Hx     Social History Social History   Tobacco Use  . Smoking status: Never Smoker  . Smokeless tobacco: Never Used  Substance Use Topics  . Alcohol use: No  . Drug use: No     Allergies   Sumatriptan succinate and Topamax [topiramate]   Review of Systems Review of Systems  Respiratory: Positive for shortness of breath.   Cardiovascular: Positive for chest pain.  All other systems reviewed and are negative.    Physical Exam Updated Vital Signs BP 132/83   Pulse 72   Temp 98.3 F (36.8 C) (Oral)   Resp 16   Ht 6\' 1"  (1.854 m)   Wt (!) 149.7 kg (330 lb)   SpO2 96%   BMI 43.54 kg/m    Physical Exam  Constitutional: He is oriented to person, place, and time.  Overweight   HENT:  Head: Normocephalic.  Mouth/Throat: Oropharynx is clear and moist.  Eyes: EOM are normal. Pupils are equal, round, and reactive to light.  Neck: Normal range of motion.  Cardiovascular: Normal rate and regular rhythm.  Pulmonary/Chest: He exhibits no tenderness.  Diminished throughout, no obvious wheezing or crackles. No obvious reproducible tenderness   Abdominal: Soft. Bowel sounds are normal.  Musculoskeletal: Normal range of motion.       Right lower leg: Normal. He exhibits no tenderness and no edema.       Left lower leg: Normal. He exhibits no tenderness and no edema.  Neurological: He is alert and oriented to person, place, and time.  Skin: Skin is warm.  Psychiatric: He has a normal mood and affect.  Nursing note and vitals reviewed.    ED Treatments / Results  Labs (all labs ordered are listed, but only abnormal results are displayed) Labs Reviewed  CBC WITH DIFFERENTIAL/PLATELET - Abnormal; Notable for the following components:      Result Value   WBC 11.6 (*)    All other components within normal limits  COMPREHENSIVE METABOLIC PANEL - Abnormal; Notable for the following components:   CO2 21 (*)    Glucose, Bld 160 (*)    AST 52 (*)    ALT 69 (*)    All other components within normal limits  TROPONIN I  D-DIMER, QUANTITATIVE (NOT AT [facility])  TROPONIN I    EKG  EKG Interpretation  Date/Time:  Tuesday January 28 2018 12:19:40 EST Ventricular Rate:  80 PR Interval:  158 QRS Duration: 102 QT Interval:  378 QTC Calculation: 435 R Axis:   80 Text Interpretation:  Normal sinus rhythm Cannot rule out Anterior infarct , age undetermined Abnormal ECG No significant change since last tracing Confirmed by Richardean Canal [phone]) on 01/28/2018 12:29:10 PM       Radiology Dg Chest 2 View  Result Date: 01/28/2018 CLINICAL DATA:  Shortness of breath and chest pain.  EXAM: CHEST  2 VIEW COMPARISON:  Chest x-ray dated May 23, 2016. FINDINGS: The cardiac silhouette remains at the upper limits of normal in size. Normal mediastinal contours. Unchanged pulmonary vascular congestion. No focal consolidation, pleural effusion, or pneumothorax. No acute osseous abnormality. IMPRESSION: Stable vascular congestion without overt pulmonary edema. Electronically Signed   By: Obie Dredge M.D.   On: 01/28/2018 12:44    Procedures Procedures (including critical care time)  Medications Ordered in ED Medications  ketorolac (TORADOL) 30 MG/ML injection 30 mg (not administered)  albuterol (PROVENTIL HFA;VENTOLIN HFA) 108 (90 Base)  MCG/ACT inhaler 2 puff (not administered)  albuterol (PROVENTIL) (2.5 MG/3ML) 0.083% nebulizer solution 5 mg (5 mg Nebulization Given 01/28/18 1304)  ipratropium (ATROVENT) nebulizer solution 0.5 mg (0.5 mg Nebulization Given 01/28/18 1304)  ketorolac (TORADOL) 30 MG/ML injection 30 mg (30 mg Intravenous Given 01/28/18 1341)  aspirin tablet 325 mg (325 mg Oral Given 01/28/18 1341)     Initial Impression / Assessment and Plan / ED Course  I have reviewed the triage vital signs and the nursing notes.  Pertinent labs & imaging results that were available during my care of the patient were reviewed by me and considered in my medical decision making (see chart for details).     Alcide EvenerChristopher A Rahmani is a 42 y.o. male here with SOB, cough, l arm and chest pain. Low risk for ACS so will get delta trop. Likely chest wall pain vs viral bronchitis vs pneumonia. Afebrile, well appearing. Will get d-dimer, labs, CXR. Will give nebs and reassess.   3:11 PM D-dimer neg. Trop neg x 2. WBC 12. Minimal wheezing now. Wonder if he has mild bronchitis. Will dc home with albuterol prn, zpack.   Final Clinical Impressions(s) / ED Diagnoses   Final diagnoses:  None    ED Discharge Orders    None       Charlynne PanderYao, David Hsienta, MD 01/28/18 1513

## 2018-01-28 NOTE — ED Triage Notes (Signed)
SOB x 2 days. Pressure in his left shoulder with radiation down his arm.

## 2018-01-28 NOTE — Discharge Instructions (Signed)
Use albuterol every 4 hrs as needed for cough or wheezing.   Take zpack as prescribed for bronchitis.   Take tylenol, motrin for pain   See your doctor  Return to ER if you have worse trouble breathing, chest pain, arm pain.

## 2018-11-11 ENCOUNTER — Encounter (HOSPITAL_BASED_OUTPATIENT_CLINIC_OR_DEPARTMENT_OTHER): Payer: Self-pay | Admitting: Emergency Medicine

## 2018-11-11 ENCOUNTER — Other Ambulatory Visit: Payer: Self-pay

## 2018-11-11 ENCOUNTER — Emergency Department (HOSPITAL_BASED_OUTPATIENT_CLINIC_OR_DEPARTMENT_OTHER): Payer: BLUE CROSS/BLUE SHIELD

## 2018-11-11 ENCOUNTER — Emergency Department (HOSPITAL_BASED_OUTPATIENT_CLINIC_OR_DEPARTMENT_OTHER)
Admission: EM | Admit: 2018-11-11 | Discharge: 2018-11-11 | Disposition: A | Discharge status: Home / Self Care | Payer: BLUE CROSS/BLUE SHIELD | Attending: Emergency Medicine | Admitting: Emergency Medicine

## 2018-11-11 DIAGNOSIS — Z7984 Long term (current) use of oral hypoglycemic drugs: Secondary | ICD-10-CM | POA: Diagnosis not present

## 2018-11-11 DIAGNOSIS — Z79899 Other long term (current) drug therapy: Secondary | ICD-10-CM | POA: Diagnosis not present

## 2018-11-11 DIAGNOSIS — E119 Type 2 diabetes mellitus without complications: Secondary | ICD-10-CM | POA: Diagnosis not present

## 2018-11-11 DIAGNOSIS — I10 Essential (primary) hypertension: Secondary | ICD-10-CM | POA: Insufficient documentation

## 2018-11-11 DIAGNOSIS — R0602 Shortness of breath: Secondary | ICD-10-CM

## 2018-11-11 DIAGNOSIS — F329 Major depressive disorder, single episode, unspecified: Secondary | ICD-10-CM | POA: Diagnosis not present

## 2018-11-11 LAB — TROPONIN I: Troponin I: <0.03 ng/mL (ref <0.03)

## 2018-11-11 LAB — BASIC METABOLIC PANEL
Anion gap: 10 (ref 5–15)
BUN: 14 mg/dL (ref 6–20)
CO2: 26 mmol/L (ref 22–32)
Calcium: 8.9 mg/dL (ref 8.9–10.3)
Chloride: 103 mmol/L (ref 98–111)
Creatinine, Ser: 0.81 mg/dL (ref 0.61–1.24)
GFR calc Af Amer: >60 mL/min (ref >60)
GFR calc non Af Amer: >60 mL/min (ref >60)
Glucose, Bld: 155 mg/dL — ABNORMAL HIGH (ref 70–99)
Potassium: 4.1 mmol/L (ref 3.5–5.1)
Sodium: 139 mmol/L (ref 135–145)

## 2018-11-11 LAB — BRAIN NATRIURETIC PEPTIDE: B Natriuretic Peptide: 10.3 pg/mL (ref 0.0–100.0)

## 2018-11-11 LAB — CBC
HCT: 46.4 % (ref 39.0–52.0)
Hemoglobin: 14.4 g/dL (ref 13.0–17.0)
MCH: 27.6 pg (ref 26.0–34.0)
MCHC: 31 g/dL (ref 30.0–36.0)
MCV: 89.1 fL (ref 80.0–100.0)
Platelets: 312 10*3/uL (ref 150–400)
RBC: 5.21 MIL/uL (ref 4.22–5.81)
RDW: 13.5 % (ref 11.5–15.5)
WBC: 7.9 10*3/uL (ref 4.0–10.5)
nRBC: 0 % (ref 0.0–0.2)

## 2018-11-11 LAB — D-DIMER, QUANTITATIVE: D-Dimer, Quant: <0.27 ug/mL-FEU (ref 0.00–0.50)

## 2018-11-11 NOTE — ED Provider Notes (Signed)
[facility] EMERGENCY DEPARTMENT Provider Note   CSN: [id number] Arrival date & time: 11/11/18  1191     History   Chief Complaint Chief Complaint  Patient presents with  . Shortness of Breath    HPI Johnathan Steele is a 42 y.o. male.  HPI Patient is a 42 year old male with a history of diabetes and hypertension who presents the emergency department with intermittent exertional shortness of breath.  Patient was recently treated with diuretics by his team with some improvement in symptoms.  He denies active chest pain at this time.  He continues to exercise and states he can continue to exercise but it seems to tire him out more than it was previously.  He underwent an echocardiogram in 2017 which demonstrated no significant abnormalities and did have a stress test at that time as well which was negative for acute ischemia.  Symptoms are mild in severity.  No orthopnea.  No unilateral leg swelling.  No history of DVT or pulmonary embolism.  Blood pressure on arrival is 180/80.  Compliant with his medications.   Past Medical History:  Diagnosis Date  . Depression   . Diabetes mellitus   . Hypertension   . Migraine   . Migraine with aura 09/08/2013  . Migraines   . OSA (obstructive sleep apnea) 09/08/2013    Patient Active Problem List   Diagnosis Date Noted  . 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 Migraine with aura 09/08/2013  . OSA (obstructive sleep apnea) 09/08/2013    Past Surgical History:  Procedure Laterality Date  . SHOULDER SURGERY    . SKIN BIOPSY          Home Medications    Prior to Admission medications   Medication Sig Start Date End Date Taking? Authorizing Provider  AMLODIPINE BESYLATE PO Take by mouth.    [provider]  azithromycin (ZITHROMAX Z-PAK) 250 MG tablet 2 po day one, then 1 daily x 4 days 01/28/18   Charlynne Pander, MD  buPROPion (WELLBUTRIN XL) 150 MG 24 hr  tablet Take 1 tablet (150 mg total) by mouth daily. For depression 08/10/16   Armandina Stammer I, NP  Cariprazine HCl (VRAYLAR) 1.5 MG CAPS Take 2 capsules (3 mg total) by mouth daily. For mood control 08/10/16   Armandina Stammer I, NP  FLUoxetine (PROZAC) 20 MG capsule Take 1 capsule (20 mg total) by mouth daily. For depression 08/10/16   Armandina Stammer I, NP  glipiZIDE (GLUCOTROL XL) 10 MG 24 hr tablet Take 1 tablet (10 mg total) by mouth daily with breakfast. 08/19/16   Sam, Ace Gins, PA-C  hydrOXYzine (ATARAX/VISTARIL) 50 MG tablet Take 1 tablet (50 mg total) by mouth every 6 (six) hours as needed for anxiety. 08/10/16   Armandina Stammer I, NP  losartan (COZAAR) 100 MG tablet Take 1 tablet (100 mg total) by mouth daily. For high blood pressure 08/10/16   Armandina Stammer I, NP  metFORMIN (GLUCOPHAGE) 1000 MG tablet Take 1 tablet (1,000 mg total) by mouth 2 (two) times daily with a meal. For diabetes 08/10/16   Armandina Stammer I, NP  metoprolol succinate (TOPROL-XL) 100 MG 24 hr tablet Take 1 tablet (100 mg total) by mouth every 12 (twelve) hours. For high blood pressure 08/10/16   Armandina Stammer I, NP  traZODone (DESYREL) 50 MG tablet Take 1 tablet (50 mg total) by mouth at bedtime as needed for sleep. 08/10/16   Sanjuana Kava, NP  Family History Family History  Problem Relation Age of Onset  . Mental illness Neg Hx     Social History Social History   Tobacco Use  . Smoking status: Never Smoker  . Smokeless tobacco: Never Used  Substance Use Topics  . Alcohol use: No  . Drug use: No     Allergies   Sumatriptan succinate and Topamax [topiramate]   Review of Systems Review of Systems  All other systems reviewed and are negative.    Physical Exam Updated Vital Signs BP 135/78 (BP Location: Left Arm)   Pulse 74   Temp 98.2 F (36.8 C) (Oral)   Resp 16   Ht 6\' 1"  (1.854 m)   Wt 136.1 kg   SpO2 98%   BMI 39.58 kg/m   Physical Exam  Constitutional: He is oriented to person, place, and time. He appears  well-developed and well-nourished.  HENT:  Head: Normocephalic and atraumatic.  Eyes: EOM are normal.  Neck: Normal range of motion.  Cardiovascular: Normal rate, regular rhythm, normal heart sounds and intact distal pulses.  Pulmonary/Chest: Effort normal and breath sounds normal. No respiratory distress.  Abdominal: Soft. He exhibits no distension. There is no tenderness.  Musculoskeletal: Normal range of motion.  Neurological: He is alert and oriented to person, place, and time.  Skin: Skin is warm and dry.  Psychiatric: He has a normal mood and affect. Judgment normal.  Nursing note and vitals reviewed.    ED Treatments / Results  Labs (all labs ordered are listed, but only abnormal results are displayed) Labs Reviewed  BASIC METABOLIC PANEL - Abnormal; Notable for the following components:      Result Value   Glucose, Bld 155 (*)    All other components within normal limits  CBC  TROPONIN I  BRAIN NATRIURETIC PEPTIDE  D-DIMER, QUANTITATIVE (NOT [facility])    EKG EKG Interpretation  Date/Time:  Tuesday November 11 2018 10:03:29 EST Ventricular Rate:  81 PR Interval:  150 QRS Duration: 100 QT Interval:  360 QTC Calculation: 418 R Axis:   71 Text Interpretation:  Normal sinus rhythm Cannot rule out Anterior infarct , age undetermined Abnormal ECG No significant change was found Confirmed by Azalia Bilisampos, Kevin ([id number]) on 11/11/2018 10:41:45 AM   Radiology Dg Chest 2 View  Result Date: 11/11/2018 CLINICAL DATA:  Shortness of breath EXAM: CHEST - 2 VIEW COMPARISON:  11/06/2018 FINDINGS: Heart and mediastinal contours are within normal limits. No focal opacities or effusions. No acute bony abnormality. IMPRESSION: No active cardiopulmonary disease. Electronically Signed   By: Charlett NoseKevin  Dover M.D.   On: 11/11/2018 10:21    Procedures Procedures (including critical care time)  Medications Ordered in ED Medications - No data to display   Initial Impression / Assessment and  Plan / ED Course  I have reviewed the triage vital signs and the nursing notes.  Pertinent labs & imaging results that were available during my care of the patient were reviewed by me and considered in my medical decision making (see chart for details).     Patient is overall well appearing.  I do not think he needs additional work-up in the emergency department at this time.  He does not need acute hospitalization.  He does need outpatient cardiology follow-up for possible repeat echocardiogram and repeat stress test.  He is encouraged to return the emergency department for new or worsening symptoms.  Final Clinical Impressions(s) / ED Diagnoses   Final diagnoses:  Shortness of breath  ED Discharge Orders    None       Azalia Bilis, MD 11/11/18 1400

## 2018-11-11 NOTE — ED Notes (Signed)
Patient transported to X-ray 

## 2018-11-11 NOTE — ED Triage Notes (Addendum)
Reports shortness of breath x 2 weeks.  States PCP believed this was due to medication he was taking and recently switched him to a diuretic. Patient diaphoretic in triage.

## 2021-02-07 ENCOUNTER — Emergency Department (HOSPITAL_BASED_OUTPATIENT_CLINIC_OR_DEPARTMENT_OTHER): Payer: BLUE CROSS/BLUE SHIELD

## 2021-02-07 ENCOUNTER — Other Ambulatory Visit: Payer: Self-pay

## 2021-02-07 ENCOUNTER — Emergency Department (HOSPITAL_BASED_OUTPATIENT_CLINIC_OR_DEPARTMENT_OTHER)
Admission: EM | Admit: 2021-02-07 | Discharge: 2021-02-07 | Disposition: A | Discharge status: Home / Self Care | Payer: BLUE CROSS/BLUE SHIELD | Attending: Emergency Medicine | Admitting: Emergency Medicine

## 2021-02-07 ENCOUNTER — Encounter (HOSPITAL_BASED_OUTPATIENT_CLINIC_OR_DEPARTMENT_OTHER): Payer: Self-pay | Admitting: *Deleted

## 2021-02-07 DIAGNOSIS — K529 Noninfective gastroenteritis and colitis, unspecified: Secondary | ICD-10-CM | POA: Insufficient documentation

## 2021-02-07 DIAGNOSIS — E119 Type 2 diabetes mellitus without complications: Secondary | ICD-10-CM | POA: Insufficient documentation

## 2021-02-07 DIAGNOSIS — R112 Nausea with vomiting, unspecified: Secondary | ICD-10-CM | POA: Diagnosis present

## 2021-02-07 DIAGNOSIS — I10 Essential (primary) hypertension: Secondary | ICD-10-CM | POA: Diagnosis not present

## 2021-02-07 DIAGNOSIS — Z79899 Other long term (current) drug therapy: Secondary | ICD-10-CM | POA: Insufficient documentation

## 2021-02-07 DIAGNOSIS — Z7984 Long term (current) use of oral hypoglycemic drugs: Secondary | ICD-10-CM | POA: Insufficient documentation

## 2021-02-07 LAB — CBC WITH DIFFERENTIAL/PLATELET
Abs Immature Granulocytes: 0.01 10*3/uL (ref 0.00–0.07)
Basophils Absolute: 0 10*3/uL (ref 0.0–0.1)
Basophils Relative: 0 %
Eosinophils Absolute: 0.1 10*3/uL (ref 0.0–0.5)
Eosinophils Relative: 1 %
HCT: 42.7 % (ref 39.0–52.0)
Hemoglobin: 14.6 g/dL (ref 13.0–17.0)
Immature Granulocytes: 0 %
Lymphocytes Relative: 28 %
Lymphs Abs: 2.2 10*3/uL (ref 0.7–4.0)
MCH: 28.7 pg (ref 26.0–34.0)
MCHC: 34.2 g/dL (ref 30.0–36.0)
MCV: 84.1 fL (ref 80.0–100.0)
Monocytes Absolute: 0.7 10*3/uL (ref 0.1–1.0)
Monocytes Relative: 8 %
Neutro Abs: 5.1 10*3/uL (ref 1.7–7.7)
Neutrophils Relative %: 63 %
Platelets: 298 10*3/uL (ref 150–400)
RBC: 5.08 MIL/uL (ref 4.22–5.81)
RDW: 12.6 % (ref 11.5–15.5)
WBC: 8.1 10*3/uL (ref 4.0–10.5)
nRBC: 0 % (ref 0.0–0.2)

## 2021-02-07 LAB — URINALYSIS, ROUTINE W REFLEX MICROSCOPIC
Bilirubin Urine: NEGATIVE
Glucose, UA: NEGATIVE mg/dL
Hgb urine dipstick: NEGATIVE
Ketones, ur: NEGATIVE mg/dL
Leukocytes,Ua: NEGATIVE
Nitrite: NEGATIVE
Protein, ur: 100 mg/dL — AB
Specific Gravity, Urine: 1.03 (ref 1.005–1.030)
pH: 6 (ref 5.0–8.0)

## 2021-02-07 LAB — COMPREHENSIVE METABOLIC PANEL
ALT: 31 U/L (ref 0–44)
AST: 29 U/L (ref 15–41)
Albumin: 4.2 g/dL (ref 3.5–5.0)
Alkaline Phosphatase: 60 U/L (ref 38–126)
Anion gap: 12 (ref 5–15)
BUN: 12 mg/dL (ref 6–20)
CO2: 23 mmol/L (ref 22–32)
Calcium: 9.1 mg/dL (ref 8.9–10.3)
Chloride: 101 mmol/L (ref 98–111)
Creatinine, Ser: 0.99 mg/dL (ref 0.61–1.24)
GFR, Estimated: >60 mL/min (ref >60)
Glucose, Bld: 176 mg/dL — ABNORMAL HIGH (ref 70–99)
Potassium: 3.9 mmol/L (ref 3.5–5.1)
Sodium: 136 mmol/L (ref 135–145)
Total Bilirubin: 1 mg/dL (ref 0.3–1.2)
Total Protein: 7.4 g/dL (ref 6.5–8.1)

## 2021-02-07 LAB — URINALYSIS, MICROSCOPIC (REFLEX)

## 2021-02-07 LAB — LIPASE, BLOOD: Lipase: 31 U/L (ref 11–51)

## 2021-02-07 MED ORDER — LOPERAMIDE HCL 2 MG PO CAPS: 2.0000 mg | ORAL_CAPSULE | Freq: Four times a day (QID) | ORAL | 0 refills | Status: AC | PRN

## 2021-02-07 MED ORDER — ONDANSETRON HCL 4 MG/2ML IJ SOLN
4.0000 mg | Freq: Once | INTRAMUSCULAR | Status: AC
  Administered 2021-02-07: 4 mg via INTRAVENOUS
  Filled 2021-02-07: qty 2

## 2021-02-07 MED ORDER — IOHEXOL 300 MG/ML  SOLN
100.0000 mL | Freq: Once | INTRAMUSCULAR | Status: AC | PRN
  Administered 2021-02-07: 100 mL via INTRAVENOUS

## 2021-02-07 MED ORDER — SODIUM CHLORIDE 0.9 % IV SOLN: INTRAVENOUS | Status: DC

## 2021-02-07 MED ORDER — HOMATROPINE HBR 5 % OP SOLN: 1.0000 [drp] | Freq: Two times a day (BID) | OPHTHALMIC | Status: DC

## 2021-02-07 MED ORDER — SODIUM CHLORIDE 0.9 % IV BOLUS
1000.0000 mL | Freq: Once | INTRAVENOUS | Status: AC
  Administered 2021-02-07: 1000 mL via INTRAVENOUS

## 2021-02-07 MED ORDER — ONDANSETRON 8 MG PO TBDP: 8.0000 mg | ORAL_TABLET | Freq: Three times a day (TID) | ORAL | 0 refills | Status: AC | PRN

## 2021-02-07 MED ORDER — MORPHINE SULFATE (PF) 4 MG/ML IV SOLN
4.0000 mg | Freq: Once | INTRAVENOUS | Status: AC
  Administered 2021-02-07: 4 mg via INTRAVENOUS
  Filled 2021-02-07: qty 1

## 2021-02-07 NOTE — Discharge Instructions (Signed)
Take the medications as needed for the nausea vomiting and diarrhea.  Follow-up with primary care doctor if not improving in a few days.  Return to the emergency room

## 2021-02-07 NOTE — ED Triage Notes (Signed)
States this past Sunday began having nausea, vomiting and abd cramps. Denies any fevers, Tolerates PO fluids now.

## 2021-02-07 NOTE — ED Provider Notes (Signed)
[facility] EMERGENCY DEPARTMENT Provider Note   CSN: [id number] Arrival date & time: 02/07/21  0725     History Chief Complaint  Patient presents with  . Abdominal Pain    Johnathan Steele is a 45 y.o. male.  HPI   Patient states he started having trouble with abdominal pain nausea vomiting diarrhea this Sunday.  He has had multiple episodes of nausea vomiting per day.  He is also had numerous episodes of loose stool diarrhea.  He has been having abdominal cramping primarily in the lower abdomen.  Seems more to be on the right side now.  He has been able to keep down some fluids.  He denies any trouble urinating.  No chest pain or shortness of breath although he does feel like it hurts to breathe sometimes with the abdominal pain.  Patient has been vaccinated for COVID  Past Medical History:  Diagnosis Date  . Depression   . Diabetes mellitus   . Hypertension   . Migraine   . Migraine with aura 09/08/2013  . Migraines   . OSA (obstructive sleep apnea) 09/08/2013    Patient Active Problem List   Diagnosis Date Noted  . 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 Migraine with aura 09/08/2013  . OSA (obstructive sleep apnea) 09/08/2013    Past Surgical History:  Procedure Laterality Date  . SHOULDER SURGERY    . SKIN BIOPSY         Family History  Problem Relation Age of Onset  . Mental illness Neg Hx     Social History   Tobacco Use  . Smoking status: Never Smoker  . Smokeless tobacco: Never Used  Substance Use Topics  . Alcohol use: No  . Drug use: No    Home Medications Prior to Admission medications   Medication Sig Start Date End Date Taking? Authorizing Provider  loperamide (IMODIUM) 2 MG capsule Take 1 capsule (2 mg total) by mouth 4 (four) times daily as needed for diarrhea or loose stools. 02/07/21  Yes Linwood Dibbles, MD  ondansetron (ZOFRAN ODT) 8 MG disintegrating tablet Take 1 tablet (8 mg  total) by mouth every 8 (eight) hours as needed for nausea or vomiting. 02/07/21  Yes Linwood Dibbles, MD  AMLODIPINE BESYLATE PO Take by mouth.    [provider]  azithromycin (ZITHROMAX Z-PAK) 250 MG tablet 2 po day one, then 1 daily x 4 days 01/28/18   Charlynne Pander, MD  buPROPion (WELLBUTRIN XL) 150 MG 24 hr tablet Take 1 tablet (150 mg total) by mouth daily. For depression 08/10/16   Armandina Stammer I, NP  Cariprazine HCl (VRAYLAR) 1.5 MG CAPS Take 2 capsules (3 mg total) by mouth daily. For mood control 08/10/16   Armandina Stammer I, NP  FLUoxetine (PROZAC) 20 MG capsule Take 1 capsule (20 mg total) by mouth daily. For depression 08/10/16   Armandina Stammer I, NP  glipiZIDE (GLUCOTROL XL) 10 MG 24 hr tablet Take 1 tablet (10 mg total) by mouth daily with breakfast. 08/19/16   Sam, Ace Gins, PA-C  hydrOXYzine (ATARAX/VISTARIL) 50 MG tablet Take 1 tablet (50 mg total) by mouth every 6 (six) hours as needed for anxiety. 08/10/16   Armandina Stammer I, NP  losartan (COZAAR) 100 MG tablet Take 1 tablet (100 mg total) by mouth daily. For high blood pressure 08/10/16   Nwoko, Nicole Kindred I, NP  metFORMIN (GLUCOPHAGE) 1000 MG tablet Take 1 tablet (1,000 mg total) by  mouth 2 (two) times daily with a meal. For diabetes 08/10/16   Armandina StammerNwoko, Agnes I, NP  metoprolol succinate (TOPROL-XL) 100 MG 24 hr tablet Take 1 tablet (100 mg total) by mouth every 12 (twelve) hours. For high blood pressure 08/10/16   Armandina StammerNwoko, Agnes I, NP  traZODone (DESYREL) 50 MG tablet Take 1 tablet (50 mg total) by mouth at bedtime as needed for sleep. 08/10/16   Armandina StammerNwoko, Agnes I, NP    Allergies    Sumatriptan succinate and Topamax [topiramate]  Review of Systems   Review of Systems  All other systems reviewed and are negative.   Physical Exam Updated Vital Signs BP 127/79 (BP Location: Right Arm)   Pulse 62   Temp 98.3 F (36.8 C) (Oral)   Resp 16   Ht 1.854 m (6\' 1" )   Wt 124.7 kg   SpO2 99%   BMI 36.28 kg/m   Physical Exam Vitals and nursing note  reviewed.  Constitutional:      Appearance: He is well-developed and well-nourished. He is not toxic-appearing.  HENT:     Head: Normocephalic and atraumatic.     Right Ear: External ear normal.     Left Ear: External ear normal.  Eyes:     General: No scleral icterus.       Right eye: No discharge.        Left eye: No discharge.     Conjunctiva/sclera: Conjunctivae normal.  Neck:     Trachea: No tracheal deviation.  Cardiovascular:     Rate and Rhythm: Normal rate and regular rhythm.     Pulses: Intact distal pulses.  Pulmonary:     Effort: Pulmonary effort is normal. No respiratory distress.     Breath sounds: Normal breath sounds. No stridor. No wheezing or rales.  Abdominal:     General: Bowel sounds are normal. There is no distension.     Palpations: Abdomen is soft.     Tenderness: There is abdominal tenderness in the right lower quadrant. There is no guarding or rebound.  Musculoskeletal:        General: No tenderness or edema.     Cervical back: Neck supple.  Skin:    General: Skin is warm and dry.     Findings: No rash.  Neurological:     Mental Status: He is alert.     Cranial Nerves: No cranial nerve deficit (no facial droop, extraocular movements intact, no slurred speech).     Sensory: No sensory deficit.     Motor: No abnormal muscle tone or seizure activity.     Coordination: Coordination normal.     Deep Tendon Reflexes: Strength normal.  Psychiatric:        Mood and Affect: Mood and affect normal.     ED Results / Procedures / Treatments   Labs (all labs ordered are listed, but only abnormal results are displayed) Labs Reviewed  COMPREHENSIVE METABOLIC PANEL - Abnormal; Notable for the following components:      Result Value   Glucose, Bld 176 (*)    All other components within normal limits  URINALYSIS, ROUTINE W REFLEX MICROSCOPIC - Abnormal; Notable for the following components:   Protein, ur 100 (*)    All other components within normal limits   URINALYSIS, MICROSCOPIC (REFLEX) - Abnormal; Notable for the following components:   Bacteria, UA RARE (*)    All other components within normal limits  LIPASE, BLOOD  CBC WITH DIFFERENTIAL/PLATELET    EKG None  Radiology CT ABDOMEN PELVIS W CONTRAST  Result Date: 02/07/2021 CLINICAL DATA:  45 year old male with right lower quadrant abdominal pain. Nausea vomiting and diarrhea for 2 days. EXAM: CT ABDOMEN AND PELVIS WITH CONTRAST TECHNIQUE: Multidetector CT imaging of the abdomen and pelvis was performed using the standard protocol following bolus administration of intravenous contrast. CONTRAST:  OMNIPAQUE IOHEXOL 300 MG/ML  SOLN COMPARISON:  Chest radiographs 11/11/2018. FINDINGS: Lower chest: Negative. Hepatobiliary: Negative aside from questionable hepatic steatosis. Pancreas: Negative. Spleen: Negative. Adrenals/Urinary Tract: Normal adrenal glands. Bilateral renal enhancement and contrast excretion is symmetric and normal. Normal proximal ureters. No nephrolithiasis. Diminutive and unremarkable urinary bladder. Incidental pelvic phleboliths and/or vas deferens calcifications. Stomach/Bowel: No dilated large or small bowel. The cecum appears to beyond a lax mesentery in the right abdomen, with anteriorly oriented ileocecal valve. Diminutive, normal appendix identified on coronal image 68. No large bowel inflammation. Negative terminal ileum aside from flocculated contents. Oral contrast in the jejunum which appears normal. Decompressed stomach and duodenum. No free air, free fluid, bowel inflammation identified. Vascular/Lymphatic: Suboptimal intravascular contrast bolus but the major arterial structures appear to remain patent. Mild Calcified aortic atherosclerosis. Main portal vein is patent. No lymphadenopathy. Reproductive: Negative. Other: No pelvic free fluid. Musculoskeletal: No acute osseous abnormality identified. Chronic disc and endplate degeneration at the lumbosacral junction.  IMPRESSION: No evidence of appendicitis. No acute or inflammatory process identified in the abdomen or pelvis. Electronically Signed   By: Odessa Fleming M.D.   On: 02/07/2021 10:47    Procedures Procedures   Medications Ordered in ED Medications  sodium chloride 0.9 % bolus 1,000 mL (0 mLs Intravenous Stopped 02/07/21 0857)    And  0.9 %  sodium chloride infusion ( Intravenous New Bag/Given 02/07/21 0859)  morphine 4 MG/ML injection 4 mg (4 mg Intravenous Given 02/07/21 0803)  ondansetron (ZOFRAN) injection 4 mg (4 mg Intravenous Given 02/07/21 0803)  iohexol (OMNIPAQUE) 300 MG/ML solution 100 mL (100 mLs Intravenous Contrast Given 02/07/21 1016)    ED Course  I have reviewed the triage vital signs and the nursing notes.  Pertinent labs & imaging results that were available during my care of the patient were reviewed by me and considered in my medical decision making (see chart for details).  Clinical Course as of 02/07/21 1128  Tue Feb 07, 2021  7782 Labs reviewed.  CBC metabolic panel unremarkable with exception of mild hyperglycemia.  UA negative.  Lipase normal. [JK]  [id number] Patient still has abdominal tenderness on exam, mostly in the right lower quadrant.  Will CT to rule out appendicitis [JK]  1054 CT scan without acute findings. [JK]    Clinical Course User Index [JK] Linwood Dibbles, MD   MDM Rules/Calculators/A&P                          Presented to the ED with complaints of abdominal pain vomiting and diarrhea.  Patient was treated with IV fluids and antiemetics.  He noted some improvement.  He did have persistent tenderness in the right lower quadrant so is concerned about the possibility appendicitis.  CT scan was performed and fortunately does not show any acute findings.  No appendicitis, diverticulitis or colitis.  Suspect his symptoms are related to gastroenteritis.  Will discharge home with course of antiemetics and antidiarrheal agents. Final Clinical Impression(s) / ED  Diagnoses Final diagnoses:  Gastroenteritis    Rx / DC Orders ED Discharge Orders  Ordered    ondansetron (ZOFRAN ODT) 8 MG disintegrating tablet  Every 8 hours PRN        02/07/21 1128    loperamide (IMODIUM) 2 MG capsule  4 times daily PRN        02/07/21 1128           Linwood Dibbles, MD 02/07/21 1130

## 2021-02-07 NOTE — ED Notes (Signed)
Pt instructed not to get up off stretcher without staff assist

## 2021-03-18 ENCOUNTER — Encounter (HOSPITAL_BASED_OUTPATIENT_CLINIC_OR_DEPARTMENT_OTHER): Payer: Self-pay | Admitting: Emergency Medicine

## 2021-03-18 ENCOUNTER — Emergency Department (HOSPITAL_BASED_OUTPATIENT_CLINIC_OR_DEPARTMENT_OTHER): Payer: BLUE CROSS/BLUE SHIELD

## 2021-03-18 ENCOUNTER — Other Ambulatory Visit: Payer: Self-pay

## 2021-03-18 ENCOUNTER — Emergency Department (HOSPITAL_BASED_OUTPATIENT_CLINIC_OR_DEPARTMENT_OTHER)
Admission: EM | Admit: 2021-03-18 | Discharge: 2021-03-18 | Disposition: A | Discharge status: Home / Self Care | Payer: BLUE CROSS/BLUE SHIELD | Attending: Emergency Medicine | Admitting: Emergency Medicine

## 2021-03-18 DIAGNOSIS — M542 Cervicalgia: Secondary | ICD-10-CM | POA: Insufficient documentation

## 2021-03-18 DIAGNOSIS — Y92410 Unspecified street and highway as the place of occurrence of the external cause: Secondary | ICD-10-CM | POA: Insufficient documentation

## 2021-03-18 DIAGNOSIS — E119 Type 2 diabetes mellitus without complications: Secondary | ICD-10-CM | POA: Insufficient documentation

## 2021-03-18 DIAGNOSIS — I10 Essential (primary) hypertension: Secondary | ICD-10-CM | POA: Diagnosis not present

## 2021-03-18 DIAGNOSIS — Z7984 Long term (current) use of oral hypoglycemic drugs: Secondary | ICD-10-CM | POA: Diagnosis not present

## 2021-03-18 DIAGNOSIS — Z79899 Other long term (current) drug therapy: Secondary | ICD-10-CM | POA: Insufficient documentation

## 2021-03-18 DIAGNOSIS — R079 Chest pain, unspecified: Secondary | ICD-10-CM | POA: Diagnosis not present

## 2021-03-18 DIAGNOSIS — M25531 Pain in right wrist: Secondary | ICD-10-CM | POA: Insufficient documentation

## 2021-03-18 DIAGNOSIS — M7918 Myalgia, other site: Secondary | ICD-10-CM

## 2021-03-18 NOTE — Discharge Instructions (Signed)
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
## 2021-07-21 ENCOUNTER — Other Ambulatory Visit: Payer: Self-pay

## 2021-07-21 ENCOUNTER — Emergency Department (HOSPITAL_BASED_OUTPATIENT_CLINIC_OR_DEPARTMENT_OTHER)
Admission: EM | Admit: 2021-07-21 | Discharge: 2021-07-21 | Disposition: A | Discharge status: Home / Self Care | Payer: BLUE CROSS/BLUE SHIELD | Attending: Emergency Medicine | Admitting: Emergency Medicine

## 2021-07-21 ENCOUNTER — Encounter (HOSPITAL_BASED_OUTPATIENT_CLINIC_OR_DEPARTMENT_OTHER): Payer: Self-pay | Admitting: *Deleted

## 2021-07-21 ENCOUNTER — Emergency Department (HOSPITAL_BASED_OUTPATIENT_CLINIC_OR_DEPARTMENT_OTHER): Payer: BLUE CROSS/BLUE SHIELD

## 2021-07-21 DIAGNOSIS — I10 Essential (primary) hypertension: Secondary | ICD-10-CM | POA: Diagnosis not present

## 2021-07-21 DIAGNOSIS — Z79899 Other long term (current) drug therapy: Secondary | ICD-10-CM | POA: Insufficient documentation

## 2021-07-21 DIAGNOSIS — E119 Type 2 diabetes mellitus without complications: Secondary | ICD-10-CM | POA: Diagnosis not present

## 2021-07-21 DIAGNOSIS — M791 Myalgia, unspecified site: Secondary | ICD-10-CM | POA: Diagnosis not present

## 2021-07-21 DIAGNOSIS — R509 Fever, unspecified: Secondary | ICD-10-CM | POA: Diagnosis not present

## 2021-07-21 DIAGNOSIS — Z7984 Long term (current) use of oral hypoglycemic drugs: Secondary | ICD-10-CM | POA: Insufficient documentation

## 2021-07-21 DIAGNOSIS — B349 Viral infection, unspecified: Secondary | ICD-10-CM

## 2021-07-21 DIAGNOSIS — Z20822 Contact with and (suspected) exposure to covid-19: Secondary | ICD-10-CM | POA: Diagnosis not present

## 2021-07-21 DIAGNOSIS — I2699 Other pulmonary embolism without acute cor pulmonale: Secondary | ICD-10-CM | POA: Insufficient documentation

## 2021-07-21 LAB — CBC WITH DIFFERENTIAL/PLATELET
Abs Immature Granulocytes: 0.02 10*3/uL (ref 0.00–0.07)
Basophils Absolute: 0 10*3/uL (ref 0.0–0.1)
Basophils Relative: 0 %
Eosinophils Absolute: 0.1 10*3/uL (ref 0.0–0.5)
Eosinophils Relative: 3 %
HCT: 42.4 % (ref 39.0–52.0)
Hemoglobin: 14.7 g/dL (ref 13.0–17.0)
Immature Granulocytes: 0 %
Lymphocytes Relative: 21 %
Lymphs Abs: 1.2 10*3/uL (ref 0.7–4.0)
MCH: 28.4 pg (ref 26.0–34.0)
MCHC: 34.7 g/dL (ref 30.0–36.0)
MCV: 81.9 fL (ref 80.0–100.0)
Monocytes Absolute: 0.5 10*3/uL (ref 0.1–1.0)
Monocytes Relative: 9 %
Neutro Abs: 3.8 10*3/uL (ref 1.7–7.7)
Neutrophils Relative %: 67 %
Platelets: 200 10*3/uL (ref 150–400)
RBC: 5.18 MIL/uL (ref 4.22–5.81)
RDW: 12.3 % (ref 11.5–15.5)
WBC: 5.7 10*3/uL (ref 4.0–10.5)
nRBC: 0 % (ref 0.0–0.2)

## 2021-07-21 LAB — COMPREHENSIVE METABOLIC PANEL
ALT: 43 U/L (ref 0–44)
AST: 45 U/L — ABNORMAL HIGH (ref 15–41)
Albumin: 3.9 g/dL (ref 3.5–5.0)
Alkaline Phosphatase: 79 U/L (ref 38–126)
Anion gap: 11 (ref 5–15)
BUN: 10 mg/dL (ref 6–20)
CO2: 24 mmol/L (ref 22–32)
Calcium: 8.2 mg/dL — ABNORMAL LOW (ref 8.9–10.3)
Chloride: 97 mmol/L — ABNORMAL LOW (ref 98–111)
Creatinine, Ser: 0.82 mg/dL (ref 0.61–1.24)
GFR, Estimated: >60 mL/min (ref >60)
Glucose, Bld: 335 mg/dL — ABNORMAL HIGH (ref 70–99)
Potassium: 3.6 mmol/L (ref 3.5–5.1)
Sodium: 132 mmol/L — ABNORMAL LOW (ref 135–145)
Total Bilirubin: 0.5 mg/dL (ref 0.3–1.2)
Total Protein: 6.9 g/dL (ref 6.5–8.1)

## 2021-07-21 LAB — MAGNESIUM: Magnesium: 1.5 mg/dL — ABNORMAL LOW (ref 1.7–2.4)

## 2021-07-21 LAB — URINALYSIS, MICROSCOPIC (REFLEX): WBC, UA: NONE SEEN WBC/hpf (ref 0–5)

## 2021-07-21 LAB — URINALYSIS, ROUTINE W REFLEX MICROSCOPIC
Bilirubin Urine: NEGATIVE
Glucose, UA: >=500 mg/dL — AB
Ketones, ur: NEGATIVE mg/dL
Leukocytes,Ua: NEGATIVE
Nitrite: NEGATIVE
Protein, ur: NEGATIVE mg/dL
Specific Gravity, Urine: 1.015 (ref 1.005–1.030)
pH: 5 (ref 5.0–8.0)

## 2021-07-21 LAB — TROPONIN I (HIGH SENSITIVITY): Troponin I (High Sensitivity): 8 ng/L (ref <18)

## 2021-07-21 LAB — LIPASE, BLOOD: Lipase: 51 U/L (ref 11–51)

## 2021-07-21 MED ORDER — ONDANSETRON HCL 4 MG/2ML IJ SOLN: 4.0000 mg | Freq: Once | INTRAMUSCULAR | Status: DC

## 2021-07-21 MED ORDER — SODIUM CHLORIDE 0.9 % IV BOLUS
1000.0000 mL | Freq: Once | INTRAVENOUS | Status: AC
  Administered 2021-07-21: 1000 mL via INTRAVENOUS

## 2021-07-21 MED ORDER — MAGNESIUM SULFATE IN D5W 1-5 GM/100ML-% IV SOLN
1.0000 g | Freq: Once | INTRAVENOUS | Status: AC
  Administered 2021-07-21: 1 g via INTRAVENOUS
  Filled 2021-07-21: qty 100

## 2021-07-21 MED ORDER — IOHEXOL 350 MG/ML SOLN
100.0000 mL | Freq: Once | INTRAVENOUS | Status: AC | PRN
  Administered 2021-07-21: 100 mL via INTRAVENOUS

## 2021-07-21 NOTE — ED Triage Notes (Signed)
He saw his doctor today for fever and body aches. His Covid flu and strep were negative. His Ddimer was elevated. He was told to come to the ER for further evaluation.

## 2021-07-21 NOTE — ED Notes (Signed)
Pt given 100cc Omnipaque 350 for CTA Chest -- pt got very nauseated & vomited immediately after scan -- pt made mention of feeling nauseous during last contrasted scan, but did not get sick or break out in hives -- pt's reaction to it this time was a little more severe than last -- may want to give pt anti-nausea medication prior to any future contrast studies.

## 2021-07-21 NOTE — Discharge Instructions (Addendum)
You were tested for COVID, if it is positive, you may qualify for Paxlovid.   Stay home until your COVID test comes back   Stay hydrated   You can take medicines as prescribed except uribel and augmentin   See your doctor for follow-up  Return to ER if you have worse chills, chest pain, shortness of breath, abdominal pain, vomiting

## 2021-07-21 NOTE — ED Provider Notes (Signed)
[facility] EMERGENCY DEPARTMENT Provider Note   CSN: [id number] Arrival date & time: 07/21/21  1500     History No chief complaint on file.   Johnathan Steele is a 45 y.o. male history of diabetes, hypertension, here presenting with fevers and chills and myalgias.  Patient has been having chills for the last 2 days.  He ran a fever 101 today.  He has diffuse muscle aches.  He went to urgent care and had positive D-dimer with negative COVID antigen test and questionable UTI.  Patient is here for CTA chest.  Patient states that he is fully vaccinated against COVID but did not have the booster  The history is provided by the patient.      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 Authorizing Provider  glipiZIDE (GLUCOTROL XL) 10 MG 24 hr tablet Take 1 tablet (10 mg total) by mouth daily with breakfast. 08/19/16  Yes London Sheer Y, PA-C  losartan (COZAAR) 100 MG tablet Take 1 tablet (100 mg total) by mouth daily. For high blood pressure 08/10/16  Yes Nwoko, Nicole Kindred I, NP  metFORMIN (GLUCOPHAGE) 1000 MG tablet Take 1 tablet (1,000 mg total) by mouth 2 (two)  times daily with a meal. For diabetes 08/10/16  Yes Nwoko, Nicole Kindred I, NP  metoprolol succinate (TOPROL-XL) 100 MG 24 hr tablet Take 1 tablet (100 mg total) by mouth every 12 (twelve) hours. For high blood pressure 08/10/16  Yes Nwoko, Agnes I, NP  AMLODIPINE BESYLATE PO Take by mouth.    [provider]  azithromycin (ZITHROMAX Z-PAK) 250 MG tablet 2 po day one, then 1 daily x 4 days 01/28/18   Charlynne Pander, MD  buPROPion (WELLBUTRIN XL) 150 MG 24 hr tablet Take 1 tablet (150 mg total) by mouth daily. For depression 08/10/16   Armandina Stammer I, NP  Cariprazine HCl (VRAYLAR) 1.5 MG CAPS Take 2 capsules (3 mg total) by mouth daily. For mood control 08/10/16   Armandina Stammer I, NP  FLUoxetine (PROZAC) 20 MG capsule Take 1 capsule (20 mg total) by mouth daily. For depression 08/10/16   Armandina Stammer I, NP  hydrOXYzine (ATARAX/VISTARIL) 50 MG tablet Take 1 tablet (50 mg total) by mouth every 6 (six) hours as needed for anxiety. 08/10/16   Armandina Stammer I, NP  loperamide (IMODIUM) 2 MG capsule Take 1 capsule (2 mg total) by mouth 4 (four) times daily as needed for diarrhea or loose stools. 02/07/21   Linwood Dibbles, MD  ondansetron (ZOFRAN ODT) 8 MG disintegrating tablet Take 1 tablet (8 mg total) by mouth every 8 (eight) hours as needed for nausea or vomiting. 02/07/21   Linwood Dibbles, MD  traZODone (DESYREL) 50 MG tablet Take 1 tablet (50 mg total) by mouth at bedtime as needed for sleep. 08/10/16   Armandina Stammer I, NP    Allergies    Sumatriptan succinate and Topamax [topiramate]  Review of Systems   Review of Systems  Constitutional:  Positive for chills.  All other systems reviewed and are negative.  Physical Exam Updated Vital Signs BP 120/84 (BP Location: Left Arm)   Pulse 93   Temp 98.7 F (37.1 C) (Oral)   Resp (!) 23   Ht 6\' 1"  (1.854 m)   Wt 131 kg   SpO2 97%   BMI 38.10 kg/m   Physical Exam Vitals and nursing note reviewed.  Constitutional:      Appearance: Normal appearance.  HENT:      Head: Normocephalic.     Nose: Nose normal.     Mouth/Throat:     Mouth: Mucous membranes are moist.  Eyes:     Extraocular Movements: Extraocular movements intact.     Pupils: Pupils are equal, round, and reactive to light.  Cardiovascular:     Rate and Rhythm: Normal rate and regular rhythm.     Pulses: Normal pulses.     Heart sounds: Normal heart sounds.  Pulmonary:     Effort: Pulmonary effort is normal.     Breath sounds: Normal breath sounds.  Abdominal:     General: Abdomen is flat.     Palpations: Abdomen is soft.  Musculoskeletal:        General: Normal range of motion.     Cervical back: Normal range of motion and neck supple.  Skin:    General: Skin is warm.  Neurological:     General: No focal deficit present.     Mental Status: He is alert and oriented to person, place, and time.  Psychiatric:        Mood and Affect: Mood normal.        Behavior: Behavior normal.    ED Results / Procedures / Treatments   Labs (all labs ordered are listed, but only abnormal results are displayed) Labs Reviewed  COMPREHENSIVE METABOLIC PANEL - Abnormal; Notable for the following components:      Result Value   Sodium 132 (*)    Chloride 97 (*)    Glucose, Bld 335 (*)    Calcium 8.2 (*)    AST 45 (*)    All other components within normal limits  MAGNESIUM - Abnormal; Notable for the following components:   Magnesium 1.5 (*)    All other components within normal limits  URINALYSIS, ROUTINE W REFLEX MICROSCOPIC - Abnormal; Notable for the following components:   Glucose, UA >=500 (*)    Hgb urine dipstick SMALL (*)    All other components within normal limits  URINALYSIS, MICROSCOPIC (REFLEX) - Abnormal; Notable for the following components:   Bacteria, UA RARE (*)    All other components within normal limits  SARS CORONAVIRUS 2 (TAT 6-24 HRS)  CBC WITH DIFFERENTIAL/PLATELET  LIPASE, BLOOD  TROPONIN I (HIGH SENSITIVITY)  TROPONIN I (HIGH SENSITIVITY)    EKG EKG  Interpretation  Date/Time:  Friday July 21 2021 15:18:58 EDT Ventricular Rate:  90 PR Interval:  167 QRS Duration: 111 QT Interval:  359 QTC Calculation: 440 R Axis:  96 Text Interpretation: Right and left arm electrode reversal, interpretation assumes no reversal Sinus rhythm Probable lateral infarct, age indeterminate No significant change since last tracing Confirmed by Richardean Canal [phone] on 07/21/2021 3:59:09 PM  Radiology CT Angio Chest PE W and/or Wo Contrast  Result Date: 07/21/2021 CLINICAL DATA:  PE suspected, low/intermediate prob, positive D-dimer. saw his doctor today for fever and body aches. His Covid flu and strep were negative. His Ddimer was elevated. He was told to come to the ER for further evaluation. EXAM: CT ANGIOGRAPHY CHEST WITH CONTRAST TECHNIQUE: Multidetector CT imaging of the chest was performed using the standard protocol during bolus administration of intravenous contrast. Multiplanar CT image reconstructions and MIPs were obtained to evaluate the vascular anatomy. CONTRAST:  OMNIPAQUE IOHEXOL 350 MG/ML SOLN COMPARISON:  Chest x-ray 03/18/2021 FINDINGS: Cardiovascular: Poor opacification of the pulmonary arteries to the central level due to timing of intravenous contrast. No evidence of pulmonary embolism. Normal heart size. No pericardial effusion. Mediastinum/Nodes: No enlarged mediastinal, hilar, or axillary lymph nodes. Thyroid gland, trachea, and esophagus demonstrate no significant findings. Lungs/Pleura: No focal consolidation. No pulmonary nodule. No pulmonary mass. No pleural effusion. No pneumothorax. Upper Abdomen: No acute abnormality. 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 Review of the MIP images confirms the above findings. IMPRESSION: 1. No central pulmonary embolus. Unable to evaluate more distally due  to timing of contrast. 2. No acute intrathoracic abnormality. Electronically Signed   By: Tish Frederickson M.D.   On: 07/21/2021 16:35    Procedures Procedures   Medications Ordered in ED Medications  magnesium sulfate IVPB 1 g 100 mL (1 g Intravenous New Bag/Given 07/21/21 1624)  ondansetron (ZOFRAN) injection 4 mg (0 mg Intravenous Hold 07/21/21 1625)  sodium chloride 0.9 % bolus 1,000 mL (1,000 mLs Intravenous New Bag/Given 07/21/21 1532)  iohexol (OMNIPAQUE) 350 MG/ML injection 100 mL (100 mLs Intravenous Contrast Given 07/21/21 1603)    ED Course  I have reviewed the triage vital signs and the nursing notes.  Pertinent labs & imaging results that were available during my care of the patient were reviewed by me and considered in my medical decision making (see chart for details).    MDM Rules/Calculators/A&P                          CATON POPOWSKI is a 45 y.o. male here presenting with chills and low-grade temperature.  Patient had elevated D-dimer in urgent care.  We will get CTA chest.  We will get basic blood work and repeat urinalysis  5:31 PM Patient's white blood cell count is normal.  CTA did not show any PE or pneumonia.  Urinalysis did not show any obvious UTI.  Patient was prescribed Augmentin for possible UTI.  I told him that he does not need to take it.  He only had the COVID antigen test so we will get a PCR.  Told him to stay home until PCR results.   Final Clinical Impression(s) / ED Diagnoses Final diagnoses:  None    Rx / DC Orders ED Discharge Orders     None        Charlynne Pander, MD 07/21/21 1732

## 2021-07-21 NOTE — ED Notes (Signed)
Patient Alert and oriented to baseline. Stable and ambulatory to baseline. Patient verbalized understanding of the discharge instructions.  Patient belongings were taken by the patient.   

## 2021-07-22 LAB — SARS CORONAVIRUS 2 (TAT 6-24 HRS): SARS Coronavirus 2: NEGATIVE

## 2021-12-22 IMAGING — CT CT ANGIO CHEST
2 of 6 series · 18 of 36 positions shown · IV contrast (Omnipaque)
Comparison: Chest x-ray 03/18/2021

CLINICAL DATA: PE suspected, low/intermediate prob, positive
D-dimer. saw his doctor today for fever and body aches. His Covid
flu and strep were negative. His Ddimer was elevated. He was told to
come to the ER for further evaluation.

EXAM:
CT ANGIOGRAPHY CHEST WITH CONTRAST
TECHNIQUE: Multidetector CT imaging of the chest was performed using the
standard protocol during bolus administration of intravenous
contrast. Multiplanar CT image reconstructions and MIPs were
obtained to evaluate the vascular anatomy.
CONTRAST:  100mL OMNIPAQUE IOHEXOL 350 MG/ML SOLN

[Series 8: pe thins · axial · 0.84mm/px · z∈[-366,-98]mm · 15 of 301 slices shown]
[im 16/301  lung]
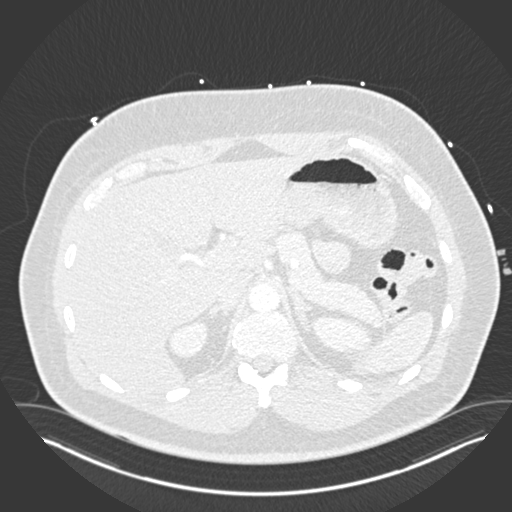
[im 32/301  mediastinal]
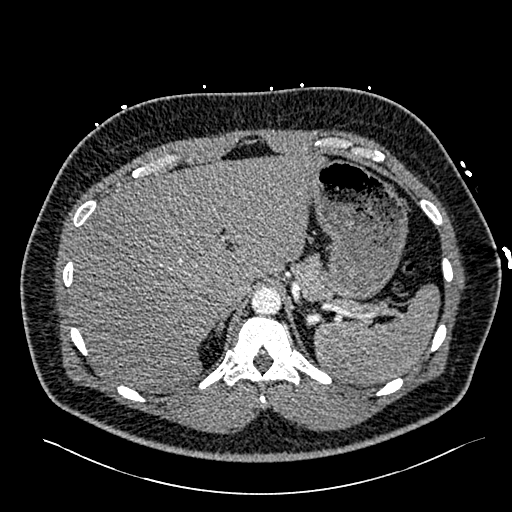
[im 64/301  lung]
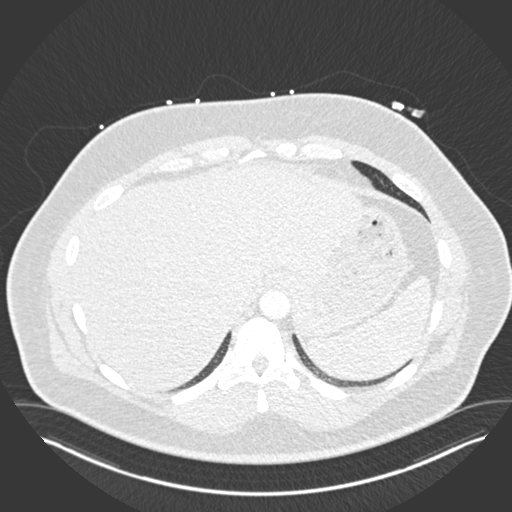
[im 79/301  mediastinal]
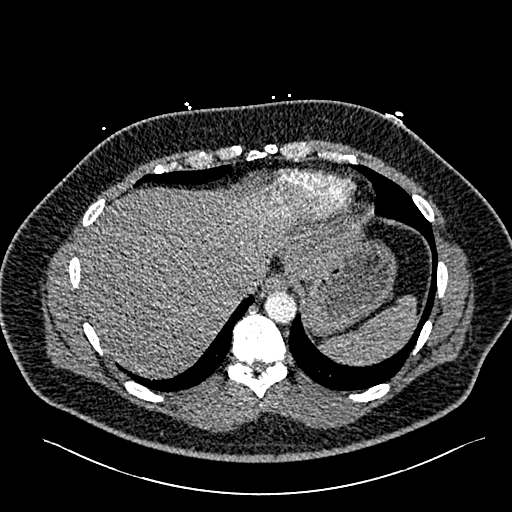
[im 95/301  lung]
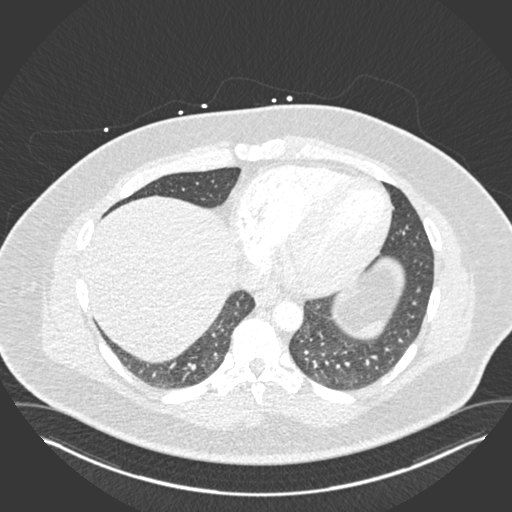
[im 111/301  mediastinal]
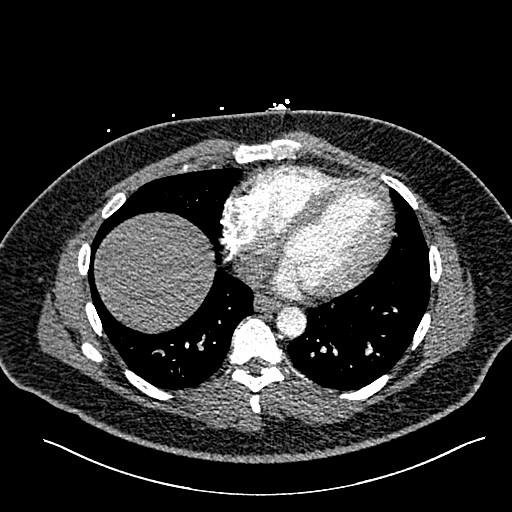
[im 143/301  lung]
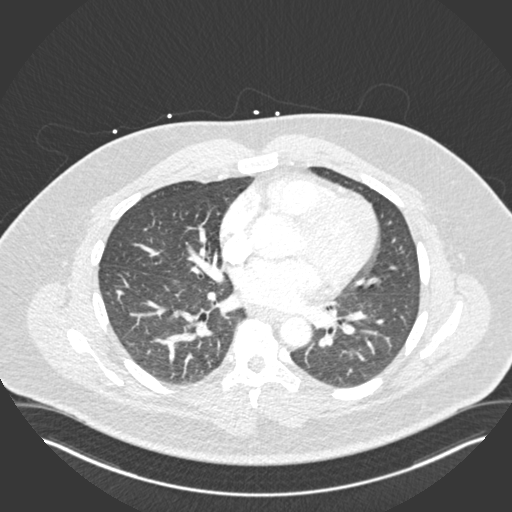
[im 148/301  mediastinal]
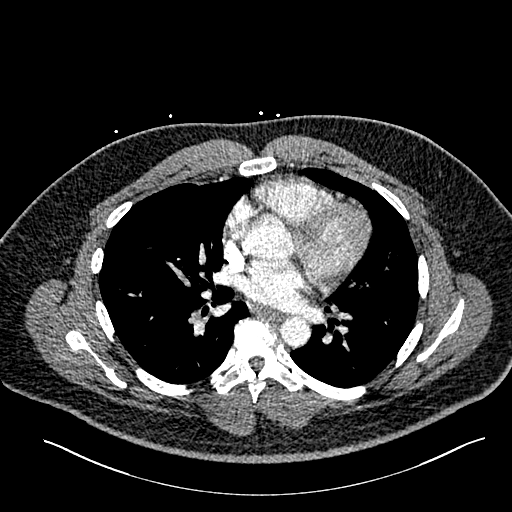
[im 158/301  lung]
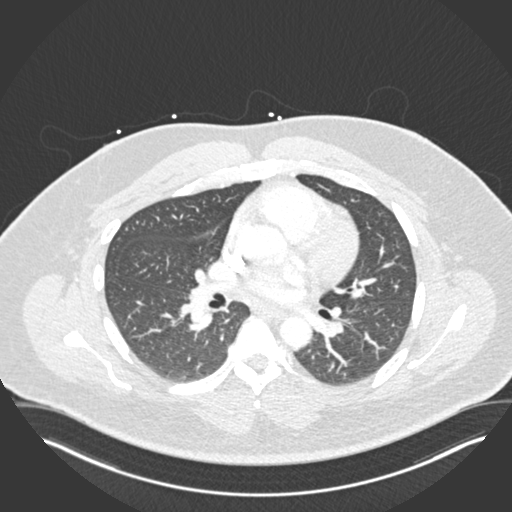
[im 190/301  mediastinal]
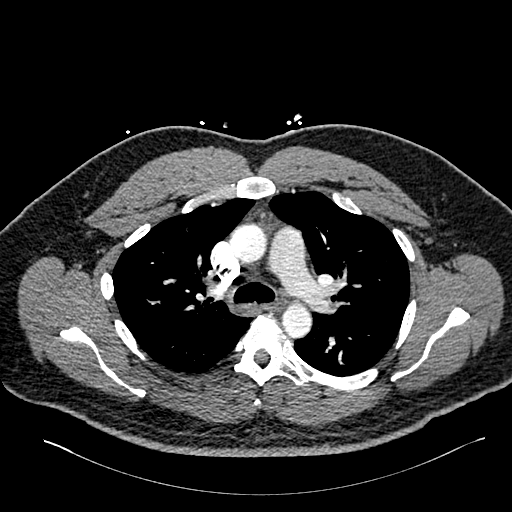
[im 206/301  lung]
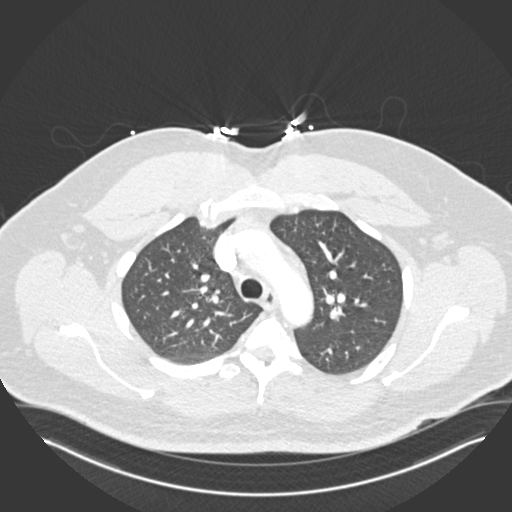
[im 222/301  mediastinal]
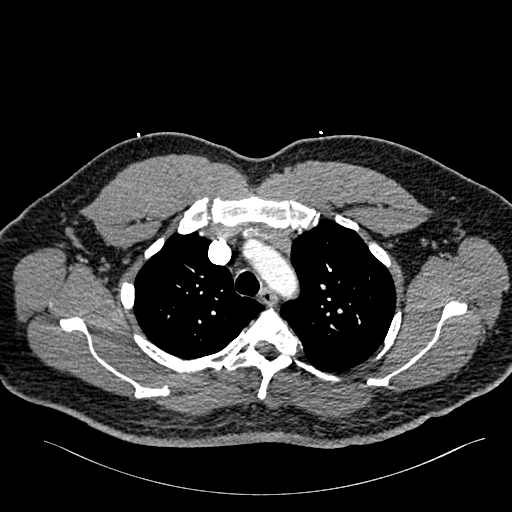
[im 237/301  lung]
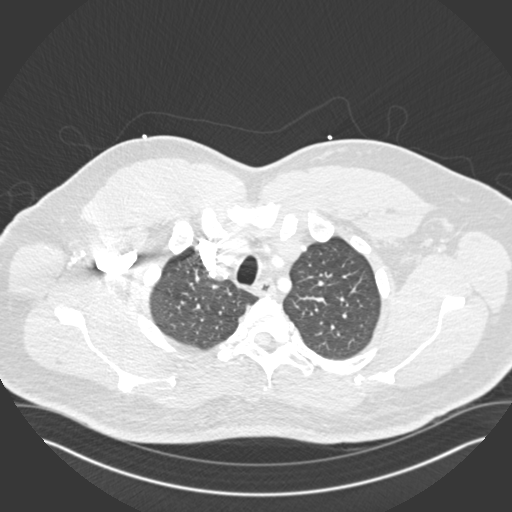
[im 269/301  mediastinal]
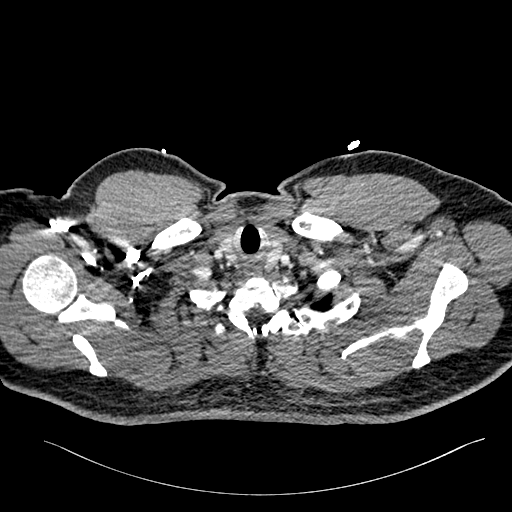
[im 285/301  lung]
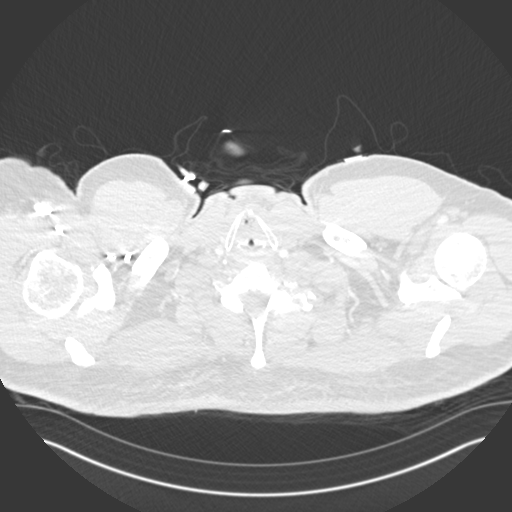

[Series 9: pe coronal mpr · coronal · 0.65mm/px · 3 of 163 slices shown]
[im 33/163  mediastinal]
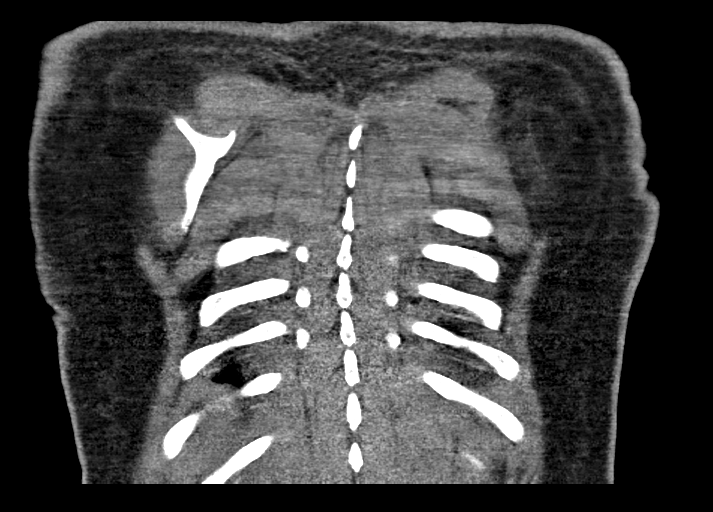
[im 65/163  mediastinal]
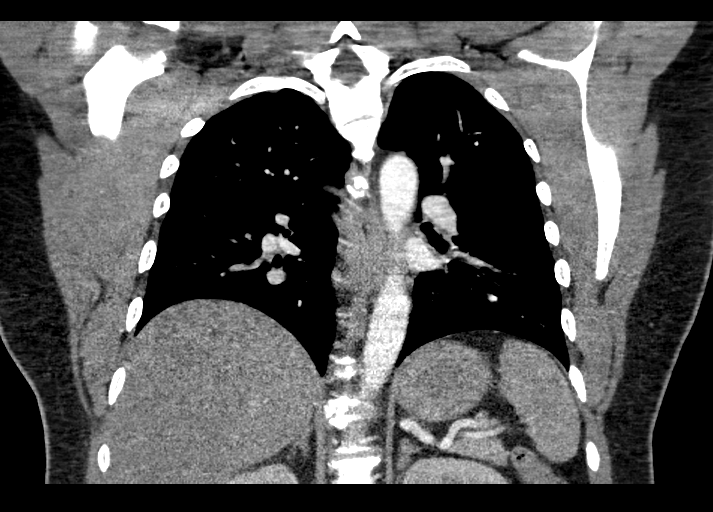
[im 98/163  mediastinal]
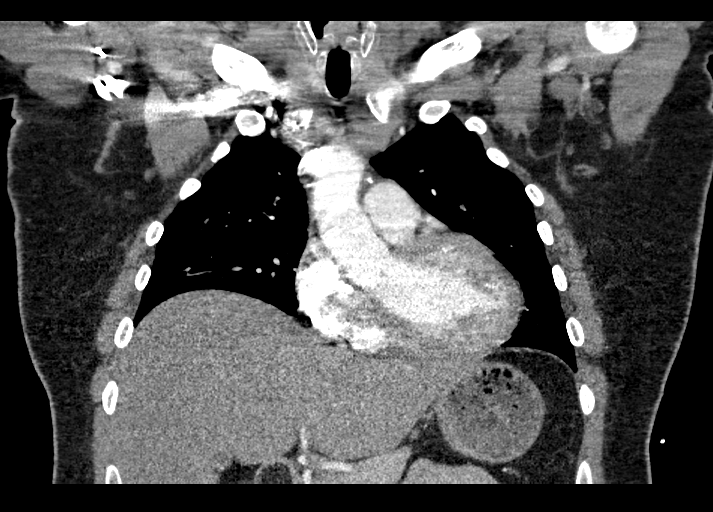

[18 of 36 positions shown; findings below may reference images not displayed]

FINDINGS: Cardiovascular: Poor opacification of the pulmonary arteries to the
central level due to timing of intravenous contrast. No evidence of
pulmonary embolism. Normal heart size. No pericardial effusion.

Mediastinum/Nodes: No enlarged mediastinal, hilar, or axillary lymph
nodes. Thyroid gland, trachea, and esophagus demonstrate no
significant findings.

Lungs/Pleura: No focal consolidation. No pulmonary nodule. No
pulmonary mass. No pleural effusion. No pneumothorax.

Upper Abdomen: No acute abnormality.

Musculoskeletal: Congenital deformity (possibly hemi
vertebra/butterfly vertebra) of the upper thoracic spine with
associated degenerative changes and mild levocurvature of the spine.

Review of the MIP images confirms the above findings.
IMPRESSION: 1. No central pulmonary embolus. Unable to evaluate more distally
due to timing of contrast.
2. No acute intrathoracic abnormality.

## 2022-04-18 ENCOUNTER — Other Ambulatory Visit: Payer: Self-pay

## 2022-04-18 ENCOUNTER — Encounter (HOSPITAL_BASED_OUTPATIENT_CLINIC_OR_DEPARTMENT_OTHER): Payer: Self-pay | Admitting: Emergency Medicine

## 2022-04-18 ENCOUNTER — Emergency Department (HOSPITAL_BASED_OUTPATIENT_CLINIC_OR_DEPARTMENT_OTHER)
Admission: EM | Admit: 2022-04-18 | Discharge: 2022-04-18 | Disposition: A | Discharge status: Home / Self Care | Payer: 59 | Attending: Emergency Medicine | Admitting: Emergency Medicine

## 2022-04-18 ENCOUNTER — Emergency Department (HOSPITAL_BASED_OUTPATIENT_CLINIC_OR_DEPARTMENT_OTHER): Payer: 59

## 2022-04-18 DIAGNOSIS — R739 Hyperglycemia, unspecified: Secondary | ICD-10-CM | POA: Diagnosis not present

## 2022-04-18 DIAGNOSIS — Z79899 Other long term (current) drug therapy: Secondary | ICD-10-CM | POA: Insufficient documentation

## 2022-04-18 DIAGNOSIS — R0602 Shortness of breath: Secondary | ICD-10-CM | POA: Diagnosis present

## 2022-04-18 LAB — CBC WITH DIFFERENTIAL/PLATELET
Abs Immature Granulocytes: 0.02 10*3/uL (ref 0.00–0.07)
Basophils Absolute: 0 10*3/uL (ref 0.0–0.1)
Basophils Relative: 1 %
Eosinophils Absolute: 0.1 10*3/uL (ref 0.0–0.5)
Eosinophils Relative: 1 %
HCT: 41.8 % (ref 39.0–52.0)
Hemoglobin: 13.9 g/dL (ref 13.0–17.0)
Immature Granulocytes: 0 %
Lymphocytes Relative: 27 %
Lymphs Abs: 2.2 10*3/uL (ref 0.7–4.0)
MCH: 27.6 pg (ref 26.0–34.0)
MCHC: 33.3 g/dL (ref 30.0–36.0)
MCV: 82.9 fL (ref 80.0–100.0)
Monocytes Absolute: 0.6 10*3/uL (ref 0.1–1.0)
Monocytes Relative: 7 %
Neutro Abs: 5.2 10*3/uL (ref 1.7–7.7)
Neutrophils Relative %: 64 %
Platelets: 263 10*3/uL (ref 150–400)
RBC: 5.04 MIL/uL (ref 4.22–5.81)
RDW: 12.6 % (ref 11.5–15.5)
WBC: 8.1 10*3/uL (ref 4.0–10.5)
nRBC: 0 % (ref 0.0–0.2)

## 2022-04-18 LAB — BASIC METABOLIC PANEL
Anion gap: 11 (ref 5–15)
BUN: 12 mg/dL (ref 6–20)
CO2: 20 mmol/L — ABNORMAL LOW (ref 22–32)
Calcium: 8.7 mg/dL — ABNORMAL LOW (ref 8.9–10.3)
Chloride: 102 mmol/L (ref 98–111)
Creatinine, Ser: 0.81 mg/dL (ref 0.61–1.24)
GFR, Estimated: >60 mL/min (ref >60)
Glucose, Bld: 368 mg/dL — ABNORMAL HIGH (ref 70–99)
Potassium: 3.9 mmol/L (ref 3.5–5.1)
Sodium: 133 mmol/L — ABNORMAL LOW (ref 135–145)

## 2022-04-18 LAB — TROPONIN I (HIGH SENSITIVITY): Troponin I (High Sensitivity): 2 ng/L (ref <18)

## 2022-04-18 NOTE — ED Provider Notes (Signed)
?[facility] EMERGENCY DEPARTMENT ?Provider Note ? ? ?CSN: [id number] ?Arrival date & time: 04/18/22  0744 ? ?  ? ?History ? ?Chief Complaint  ?Patient presents with  ? Shortness of Breath  ? ? ?Johnathan Steele is a 45 y.o. male. ? ?Here with shortness of breath for 2 days.  Mild cough.  No sputum production.  No fevers or chills.  Baby chest pressure.  Denies any headache, weakness, numbness, abdominal pain.  Nothing makes it worse or better.  He wears CPAP at night.  Has a history of OSA.  Denies any current chest pain.  Has not had any recent surgery, travel or blood clot history. ? ?The history is provided by the patient.  ? ?  ? ?Home Medications ?Prior to Admission medications   ?Medication Sig Start Date End Date Taking? Authorizing Provider  ?FLUoxetine (PROZAC) 20 MG capsule Take 1 capsule (20 mg total) by mouth daily. For depression 08/10/16  Yes Armandina Stammer I, NP  ?glipiZIDE (GLUCOTROL XL) 10 MG 24 hr tablet Take 1 tablet (10 mg total) by mouth daily with breakfast. 08/19/16  Yes Eliseo Squires, PA-C  ?hydrOXYzine (ATARAX/VISTARIL) 50 MG tablet Take 1 tablet (50 mg total) by mouth every 6 (six) hours as needed for anxiety. 08/10/16  Yes Armandina Stammer I, NP  ?losartan (COZAAR) 100 MG tablet Take 1 tablet (100 mg total) by mouth daily. For high blood pressure 08/10/16  Yes Nwoko, Nicole Kindred I, NP  ?metFORMIN (GLUCOPHAGE) 1000 MG tablet Take 1 tablet (1,000 mg total) by mouth 2 (two) times daily with a meal. For diabetes 08/10/16  Yes Nwoko, Nicole Kindred I, NP  ?metoprolol succinate (TOPROL-XL) 100 MG 24 hr tablet Take 1 tablet (100 mg total) by mouth every 12 (twelve) hours. For high blood pressure 08/10/16  Yes Sanjuana Kava, NP  ?AMLODIPINE BESYLATE PO Take by mouth.    [provider]  ?azithromycin (ZITHROMAX Z-PAK) 250 MG tablet 2 po day one, then 1 daily x 4 days 01/28/18   Charlynne Pander, MD  ?buPROPion (WELLBUTRIN XL) 150 MG 24 hr tablet Take 1 tablet (150 mg total) by mouth daily. For  depression 08/10/16   Sanjuana Kava, NP  ?Cariprazine HCl (VRAYLAR) 1.5 MG CAPS Take 2 capsules (3 mg total) by mouth daily. For mood control 08/10/16   Armandina Stammer I, NP  ?loperamide (IMODIUM) 2 MG capsule Take 1 capsule (2 mg total) by mouth 4 (four) times daily as needed for diarrhea or loose stools. 02/07/21   Linwood Dibbles, MD  ?ondansetron (ZOFRAN ODT) 8 MG disintegrating tablet Take 1 tablet (8 mg total) by mouth every 8 (eight) hours as needed for nausea or vomiting. 02/07/21   Linwood Dibbles, MD  ?traZODone (DESYREL) 50 MG tablet Take 1 tablet (50 mg total) by mouth at bedtime as needed for sleep. 08/10/16   Sanjuana Kava, NP  ?   ? ?Allergies    ?Sumatriptan succinate and Topamax [topiramate]   ? ?Review of Systems   ?Review of Systems ? ?Physical Exam ?Updated Vital Signs ?BP (!) 134/97 (BP Location: Right Arm)   Pulse 78   Temp 98.4 ?F (36.9 ?C) (Oral)   Resp 19   Ht 6\' 1"  (1.854 m)   Wt 117.9 kg   SpO2 97%   BMI 34.30 kg/m?  ?Physical Exam ?Vitals and nursing note reviewed.  ?Constitutional:   ?   General: He is not in acute distress. ?   Appearance: He is well-developed. He  is not ill-appearing.  ?HENT:  ?   Head: Normocephalic and atraumatic.  ?Eyes:  ?   Conjunctiva/sclera: Conjunctivae normal.  ?   Pupils: Pupils are equal, round, and reactive to light.  ?Cardiovascular:  ?   Rate and Rhythm: Normal rate and regular rhythm.  ?   Heart sounds: No murmur heard. ?Pulmonary:  ?   Effort: Pulmonary effort is normal. No respiratory distress.  ?   Breath sounds: Normal breath sounds. No decreased breath sounds or wheezing.  ?Abdominal:  ?   Palpations: Abdomen is soft.  ?   Tenderness: There is no abdominal tenderness.  ?Musculoskeletal:     ?   General: No swelling.  ?   Cervical back: Normal range of motion and neck supple.  ?   Right lower leg: No edema.  ?   Left lower leg: No edema.  ?Skin: ?   General: Skin is warm and dry.  ?   Capillary Refill: Capillary refill takes less than 2 seconds.  ?Neurological:   ?   Mental Status: He is alert.  ?Psychiatric:     ?   Mood and Affect: Mood normal.  ? ? ?ED Results / Procedures / Treatments   ?Labs ?(all labs ordered are listed, but only abnormal results are displayed) ?Labs Reviewed  ?BASIC METABOLIC PANEL - Abnormal; Notable for the following components:  ?    Result Value  ? Sodium 133 (*)   ? CO2 20 (*)   ? Glucose, Bld 368 (*)   ? Calcium 8.7 (*)   ? All other components within normal limits  ?CBC WITH DIFFERENTIAL/PLATELET  ?TROPONIN I (HIGH SENSITIVITY)  ? ? ?EKG ?EKG Interpretation ? ?Date/Time:  Wednesday Apr 18 2022 07:53:41 EDT ?Ventricular Rate:  77 ?PR Interval:  162 ?QRS Duration: 108 ?QT Interval:  393 ?QTC Calculation: 445 ?R Axis:   59 ?Text Interpretation: Sinus rhythm Confirmed by Virgina Norfolkuratolo, Tate Jerkins ([id number]) on 04/18/2022 7:55:20 AM ? ?Radiology ?DG Chest Portable 1 View ? ?Result Date: 04/18/2022 ?CLINICAL DATA:  A 46 year old male presents with shortness of breath for 2 days. EXAM: PORTABLE CHEST 1 VIEW COMPARISON:  March 18, 2021. FINDINGS: EKG leads project over the chest. Cardiomediastinal contours and hilar structures are normal. Lungs are clear. No visible pneumothorax or gross evidence of pleural effusion. On limited assessment there is no acute skeletal finding. IMPRESSION: No acute cardiopulmonary process. Electronically Signed   By: Donzetta KohutGeoffrey  Wile M.D.   On: 04/18/2022 08:08   ? ?Procedures ?Procedures  ? ? ?Medications Ordered in ED ?Medications - No data to display ? ?ED Course/ Medical Decision Making/ A&P ?  ?                        ?Medical Decision Making ?Amount and/or Complexity of Data Reviewed ?Labs: ordered. ?Radiology: ordered. ? ? ?Johnathan Steele is here with shortness of breath.  Normal vitals.  No fever.  EKG per my review and interpretation shows sinus rhythm.  No ischemic changes.  Overall looks very comfortable.  No respiratory distress.  Clear breath sounds.  Differential diagnosis is pneumonia versus anemia versus electrolyte  abnormality.,  Very low suspicion for ACS.  Heart score is 1.  Not having any active chest pain.  EKG reassuring.  Will check 1 troponin.  PERC negative and doubt PE.  We will get CBC, BMP, troponin, chest x-ray. ? ?Per my review and interpretation of labs patient has blood sugar 368 but otherwise unremarkable  labs.  No significant anemia, electrolyte abnormality, kidney injury, leukocytosis.  Troponin is normal.  I have no concern for ACS.  He does not appear to be in DKA at this time.  Recommend close follow-up with his primary care doctor to further discuss diabetes medications.  He states that he has been compliant with his medications. ? ?This chart was dictated using voice recognition software.  Despite best efforts to proofread,  errors can occur which can change the documentation meaning.  ? ? ? ? ? ? ? ?Final Clinical Impression(s) / ED Diagnoses ?Final diagnoses:  ?Hyperglycemia  ?SOB (shortness of breath)  ? ? ?Rx / DC Orders ?ED Discharge Orders   ? ? None  ? ?  ? ? ?  ?Virgina Norfolk, DO ?04/18/22 [id number] ? ?

## 2022-04-18 NOTE — Discharge Instructions (Signed)
Please continue to monitor your blood sugars at home.  If you are getting blood sugar readings over 500 please return for evaluation.  Follow-up with your primary care doctor to discuss further changes in your diabetes medications. ?

## 2022-04-18 NOTE — ED Triage Notes (Signed)
Pt via pov from home with sob x 2 days. Pt states he has had some increased difficulty "getting breath in and out." Pt speaking in complete sentences and able to ambulate to treatment room without difficulty. Pt alert & oriented, nad noted.  ?

## 2022-04-18 NOTE — ED Notes (Signed)
MD Curatolo at bedside ?

## 2023-04-14 ENCOUNTER — Encounter (HOSPITAL_BASED_OUTPATIENT_CLINIC_OR_DEPARTMENT_OTHER): Payer: Self-pay | Admitting: Emergency Medicine

## 2023-04-14 ENCOUNTER — Emergency Department (HOSPITAL_BASED_OUTPATIENT_CLINIC_OR_DEPARTMENT_OTHER)
Admission: EM | Admit: 2023-04-14 | Discharge: 2023-04-14 | Disposition: A | Discharge status: Home / Self Care | Payer: 59 | Attending: Emergency Medicine | Admitting: Emergency Medicine

## 2023-04-14 ENCOUNTER — Emergency Department (HOSPITAL_BASED_OUTPATIENT_CLINIC_OR_DEPARTMENT_OTHER): Payer: 59

## 2023-04-14 ENCOUNTER — Other Ambulatory Visit: Payer: Self-pay

## 2023-04-14 DIAGNOSIS — Z7984 Long term (current) use of oral hypoglycemic drugs: Secondary | ICD-10-CM | POA: Diagnosis not present

## 2023-04-14 DIAGNOSIS — I10 Essential (primary) hypertension: Secondary | ICD-10-CM | POA: Insufficient documentation

## 2023-04-14 DIAGNOSIS — E119 Type 2 diabetes mellitus without complications: Secondary | ICD-10-CM | POA: Diagnosis not present

## 2023-04-14 DIAGNOSIS — K2100 Gastro-esophageal reflux disease with esophagitis, without bleeding: Secondary | ICD-10-CM | POA: Diagnosis not present

## 2023-04-14 DIAGNOSIS — Z79899 Other long term (current) drug therapy: Secondary | ICD-10-CM | POA: Diagnosis not present

## 2023-04-14 DIAGNOSIS — R079 Chest pain, unspecified: Secondary | ICD-10-CM | POA: Diagnosis present

## 2023-04-14 LAB — COMPREHENSIVE METABOLIC PANEL
ALT: 32 U/L (ref 0–44)
AST: 25 U/L (ref 15–41)
Albumin: 4.3 g/dL (ref 3.5–5.0)
Alkaline Phosphatase: 83 U/L (ref 38–126)
Anion gap: 14 (ref 5–15)
BUN: 13 mg/dL (ref 6–20)
CO2: 27 mmol/L (ref 22–32)
Calcium: 10.1 mg/dL (ref 8.9–10.3)
Chloride: 94 mmol/L — ABNORMAL LOW (ref 98–111)
Creatinine, Ser: 0.91 mg/dL (ref 0.61–1.24)
GFR, Estimated: >60 mL/min (ref >60)
Glucose, Bld: 244 mg/dL — ABNORMAL HIGH (ref 70–99)
Potassium: 3.7 mmol/L (ref 3.5–5.1)
Sodium: 135 mmol/L (ref 135–145)
Total Bilirubin: 0.7 mg/dL (ref 0.3–1.2)
Total Protein: 7.7 g/dL (ref 6.5–8.1)

## 2023-04-14 LAB — CBC WITH DIFFERENTIAL/PLATELET
Abs Immature Granulocytes: 0.02 10*3/uL (ref 0.00–0.07)
Basophils Absolute: 0.1 10*3/uL (ref 0.0–0.1)
Basophils Relative: 1 %
Eosinophils Absolute: 0.1 10*3/uL (ref 0.0–0.5)
Eosinophils Relative: 1 %
HCT: 45.4 % (ref 39.0–52.0)
Hemoglobin: 15.1 g/dL (ref 13.0–17.0)
Immature Granulocytes: 0 %
Lymphocytes Relative: 31 %
Lymphs Abs: 3.2 10*3/uL (ref 0.7–4.0)
MCH: 27.5 pg (ref 26.0–34.0)
MCHC: 33.3 g/dL (ref 30.0–36.0)
MCV: 82.7 fL (ref 80.0–100.0)
Monocytes Absolute: 0.8 10*3/uL (ref 0.1–1.0)
Monocytes Relative: 8 %
Neutro Abs: 6.2 10*3/uL (ref 1.7–7.7)
Neutrophils Relative %: 59 %
Platelets: 274 10*3/uL (ref 150–400)
RBC: 5.49 MIL/uL (ref 4.22–5.81)
RDW: 12.9 % (ref 11.5–15.5)
WBC: 10.4 10*3/uL (ref 4.0–10.5)
nRBC: 0 % (ref 0.0–0.2)

## 2023-04-14 LAB — LIPASE, BLOOD: Lipase: 46 U/L (ref 11–51)

## 2023-04-14 LAB — TROPONIN I (HIGH SENSITIVITY): Troponin I (High Sensitivity): 2 ng/L (ref <18)

## 2023-04-14 MED ORDER — ALUM & MAG HYDROXIDE-SIMETH 200-200-20 MG/5ML PO SUSP
30.0000 mL | Freq: Once | ORAL | Status: AC
  Administered 2023-04-14: 30 mL via ORAL
  Filled 2023-04-14: qty 30

## 2023-04-14 MED ORDER — ONDANSETRON 4 MG PO TBDP: 4.0000 mg | ORAL_TABLET | Freq: Three times a day (TID) | ORAL | 0 refills | Status: AC | PRN

## 2023-04-14 MED ORDER — PANTOPRAZOLE SODIUM 40 MG PO TBEC: 40.0000 mg | DELAYED_RELEASE_TABLET | Freq: Every day | ORAL | 0 refills | Status: AC

## 2023-04-14 NOTE — ED Provider Notes (Signed)
[facility] Provider Note   CSN: [id number] Arrival date & time: 04/14/23  1911     History  Chief Complaint  Patient presents with   Gastroesophageal Reflux    Johnathan Steele is a 47 y.o. male.  HPI     47 year old male with a history of diabetes, hypertension, migraine, OSA, depression, presents from the urgent care with concern for burning chest pain.   Yesterday began to have a burning pain in chest going to throat, had nausea and vomiting, 4-5 times, had headache, was 6/10 tapered off now. No numbness/weakness/difficulty walking/talkin /change in vision. No abdominal pain, shortness of breath. Fatigue. No leg pain or swelling. No diarrhea.   The burning pain is worse with and brought on by eating.    Started prestiq a few months ago, on triceba for 4months  No etoh, smoking, other drugs  No fam hx of early heart disease, grandparents had heart disease  Worse with eating,  Took tums, prilosec without relief today.   Past Medical History:  Diagnosis Date   Depression    Diabetes mellitus    Hypertension    Migraine    Migraine with aura 09/08/2013   Migraines    OSA (obstructive sleep apnea) 09/08/2013     Home Medications Prior to Admission medications   Medication Sig Start Date End Date Taking? Authorizing Provider  ondansetron (ZOFRAN-ODT) 4 MG disintegrating tablet Take 1 tablet (4 mg total) by mouth every 8 (eight) hours as needed for nausea or vomiting. 04/14/23  Yes Alvira Monday, MD  pantoprazole (PROTONIX) 40 MG tablet Take 1 tablet (40 mg total) by mouth daily for 14 days. 04/14/23 04/28/23 Yes Alvira Monday, MD  AMLODIPINE BESYLATE PO Take by mouth.    [provider]  azithromycin (ZITHROMAX Z-PAK) 250 MG tablet 2 po day one, then 1 daily x 4 days 01/28/18   Charlynne Pander, MD  buPROPion (WELLBUTRIN XL) 150 MG 24 hr tablet Take 1 tablet (150 mg total) by mouth daily. For depression  08/10/16   Armandina Stammer I, NP  Cariprazine HCl (VRAYLAR) 1.5 MG CAPS Take 2 capsules (3 mg total) by mouth daily. For mood control 08/10/16   Armandina Stammer I, NP  FLUoxetine (PROZAC) 20 MG capsule Take 1 capsule (20 mg total) by mouth daily. For depression 08/10/16   Armandina Stammer I, NP  glipiZIDE (GLUCOTROL XL) 10 MG 24 hr tablet Take 1 tablet (10 mg total) by mouth daily with breakfast. 08/19/16   Eliseo Squires, PA-C  hydrOXYzine (ATARAX/VISTARIL) 50 MG tablet Take 1 tablet (50 mg total) by mouth every 6 (six) hours as needed for anxiety. 08/10/16   Armandina Stammer I, NP  loperamide (IMODIUM) 2 MG capsule Take 1 capsule (2 mg total) by mouth 4 (four) times daily as needed for diarrhea or loose stools. 02/07/21   Linwood Dibbles, MD  losartan (COZAAR) 100 MG tablet Take 1 tablet (100 mg total) by mouth daily. For high blood pressure 08/10/16   Armandina Stammer I, NP  metFORMIN (GLUCOPHAGE) 1000 MG tablet Take 1 tablet (1,000 mg total) by mouth 2 (two) times daily with a meal. For diabetes 08/10/16   Armandina Stammer I, NP  metoprolol succinate (TOPROL-XL) 100 MG 24 hr tablet Take 1 tablet (100 mg total) by mouth every 12 (twelve) hours. For high blood pressure 08/10/16   Nwoko, Nicole Kindred I, NP  ondansetron (ZOFRAN ODT) 8 MG disintegrating tablet Take 1 tablet (8 mg total)  by mouth every 8 (eight) hours as needed for nausea or vomiting. 02/07/21   Linwood Dibbles, MD  traZODone (DESYREL) 50 MG tablet Take 1 tablet (50 mg total) by mouth at bedtime as needed for sleep. 08/10/16   Armandina Stammer I, NP      Allergies    Sumatriptan succinate and Topamax [topiramate]    Review of Systems   Review of Systems  Physical Exam Updated Vital Signs BP (!) 140/80   Pulse 82   Temp 98 F (36.7 C) (Oral)   Resp 17   Ht 6\' 1"  (1.854 m)   Wt 124.7 kg   SpO2 99%   BMI 36.28 kg/m  Physical Exam Vitals and nursing note reviewed.  Constitutional:      General: He is not in acute distress.    Appearance: Normal appearance. He is well-developed.  He is not ill-appearing or diaphoretic.  HENT:     Head: Normocephalic and atraumatic.  Eyes:     General: No visual field deficit.    Extraocular Movements: Extraocular movements intact.     Conjunctiva/sclera: Conjunctivae normal.     Pupils: Pupils are equal, round, and reactive to light.  Cardiovascular:     Rate and Rhythm: Normal rate and regular rhythm.     Pulses: Normal pulses.     Heart sounds: Normal heart sounds. No murmur heard.    No friction rub. No gallop.  Pulmonary:     Effort: Pulmonary effort is normal. No respiratory distress.     Breath sounds: Normal breath sounds. No wheezing or rales.  Abdominal:     General: There is no distension.     Palpations: Abdomen is soft.     Tenderness: There is no abdominal tenderness. There is no guarding.  Musculoskeletal:        General: No swelling or tenderness.     Cervical back: Normal range of motion.  Skin:    General: Skin is warm and dry.     Findings: No erythema or rash.  Neurological:     General: No focal deficit present.     Mental Status: He is alert and oriented to person, place, and time.     GCS: GCS eye subscore is 4. GCS verbal subscore is 5. GCS motor subscore is 6.     Cranial Nerves: No cranial nerve deficit, dysarthria or facial asymmetry.     Sensory: No sensory deficit.     Motor: No weakness or tremor.     Coordination: Coordination normal. Finger-Nose-Finger Test normal.     Gait: Gait normal.     ED Results / Procedures / Treatments   Labs (all labs ordered are listed, but only abnormal results are displayed) Labs Reviewed  COMPREHENSIVE METABOLIC PANEL - Abnormal; Notable for the following components:      Result Value   Chloride 94 (*)    Glucose, Bld 244 (*)    All other components within normal limits  CBC WITH DIFFERENTIAL/PLATELET  LIPASE, BLOOD  TROPONIN I (HIGH SENSITIVITY)    EKG EKG Interpretation  Date/Time:  Sunday Apr 14 2023 19:22:03 EDT Ventricular Rate:  93 PR  Interval:  154 QRS Duration: 109 QT Interval:  351 QTC Calculation: 437 R Axis:   64 Text Interpretation: Sinus rhythm No significant change since last tracing Confirmed by Alvira Monday ([id number]) on 04/14/2023 11:38:47 PM  Radiology DG Chest 2 View  Result Date: 04/14/2023 CLINICAL DATA:  Chest pain EXAM: CHEST - 2 VIEW COMPARISON:  Chest radiograph 04/18/2022 FINDINGS: Normal cardiomediastinal contours. The lungs are clear. No pneumothorax or pleural effusion. No acute finding in the visualized skeleton. IMPRESSION: No acute cardiopulmonary finding. Electronically Signed   By: Emmaline Kluver M.D.   On: 04/14/2023 19:51    Procedures Procedures    Medications Ordered in ED Medications  alum & mag hydroxide-simeth (MAALOX/MYLANTA) 200-200-20 MG/5ML suspension 30 mL (30 mLs Oral Given 04/14/23 1950)    ED Course/ Medical Decision Making/ A&P                               47 year old male with a history of diabetes, hypertension, migraine, OSA, depression, presents from the urgent care with concern for burning chest pain.  Differential diagnosis for chest pain includes pulmonary embolus, dissection, pneumothorax, pneumonia, ACS, myocarditis, pericarditis, GERD, pancreatitis, esophagitis.    Also does report had headache that resolved--normal neurologic exam, not thunderclap, no trauma- doubt meningitis/SAH/SDH.  EKG was done and evaluate by me and showed no acute ST changes and no signs of pericarditis.   Chest x-ray was done and evaluated by me and radiology and showed no sign of pneumonia or pneumothorax.  Low clinical concern for pulmonary embolus, or aortic dissection by history and exam.  Labs completed and personally about interpreted by me show no significant anemia, leukocytosis, signs of DKA or significant electrolyte abnormalities.  Troponin is negative after more than 6 hours of continuous pain and have low suspicion for ACS.  Given pain is burning, brought on by  eating/drinking discussed suspect likely GERD or esophageal etiology. Improved with GI cocktail.  Given prescription for Protonix and Zofran, recommend PCP follow-up, consideration of GI follow-up if continued symptoms.         Final Clinical Impression(s) / ED Diagnoses Final diagnoses:  Chest pain, unspecified type  Gastroesophageal reflux disease with esophagitis, unspecified whether hemorrhage    Rx / DC Orders ED Discharge Orders          Ordered    pantoprazole (PROTONIX) 40 MG tablet  Daily        04/14/23 2119    ondansetron (ZOFRAN-ODT) 4 MG disintegrating tablet  Every 8 hours PRN        04/14/23 2119              Alvira Monday, MD 04/14/23 2344

## 2023-04-14 NOTE — ED Notes (Signed)
Pt reports reflux type symptoms today, epigastric burning sensation and also "throat burning from acid" TUMS OTC not very effective

## 2023-04-14 NOTE — ED Triage Notes (Signed)
Pt with burning sensation to upper middle chest into throat area, worsens after eating or drinking; vomited 4-5 times yesterday

## 2023-10-01 ENCOUNTER — Encounter (HOSPITAL_BASED_OUTPATIENT_CLINIC_OR_DEPARTMENT_OTHER): Payer: Self-pay | Admitting: Emergency Medicine

## 2023-10-01 ENCOUNTER — Other Ambulatory Visit: Payer: Self-pay

## 2023-10-01 ENCOUNTER — Emergency Department (HOSPITAL_BASED_OUTPATIENT_CLINIC_OR_DEPARTMENT_OTHER)
Admission: EM | Admit: 2023-10-01 | Discharge: 2023-10-01 | Disposition: A | Discharge status: Home / Self Care | Payer: Managed Care, Other (non HMO) | Attending: Emergency Medicine | Admitting: Emergency Medicine

## 2023-10-01 DIAGNOSIS — E119 Type 2 diabetes mellitus without complications: Secondary | ICD-10-CM | POA: Insufficient documentation

## 2023-10-01 DIAGNOSIS — R531 Weakness: Secondary | ICD-10-CM | POA: Insufficient documentation

## 2023-10-01 DIAGNOSIS — G8929 Other chronic pain: Secondary | ICD-10-CM | POA: Insufficient documentation

## 2023-10-01 DIAGNOSIS — R5383 Other fatigue: Secondary | ICD-10-CM | POA: Diagnosis present

## 2023-10-01 DIAGNOSIS — M542 Cervicalgia: Secondary | ICD-10-CM | POA: Diagnosis not present

## 2023-10-01 DIAGNOSIS — I10 Essential (primary) hypertension: Secondary | ICD-10-CM | POA: Diagnosis not present

## 2023-10-01 DIAGNOSIS — E860 Dehydration: Secondary | ICD-10-CM | POA: Insufficient documentation

## 2023-10-01 DIAGNOSIS — M545 Low back pain, unspecified: Secondary | ICD-10-CM | POA: Diagnosis not present

## 2023-10-01 DIAGNOSIS — R000 Tachycardia, unspecified: Secondary | ICD-10-CM | POA: Diagnosis not present

## 2023-10-01 DIAGNOSIS — Z79899 Other long term (current) drug therapy: Secondary | ICD-10-CM | POA: Diagnosis not present

## 2023-10-01 LAB — CBC WITH DIFFERENTIAL/PLATELET
Abs Immature Granulocytes: 0.02 10*3/uL (ref 0.00–0.07)
Basophils Absolute: 0.1 10*3/uL (ref 0.0–0.1)
Basophils Relative: 1 %
Eosinophils Absolute: 0.1 10*3/uL (ref 0.0–0.5)
Eosinophils Relative: 1 %
HCT: 39.6 % (ref 39.0–52.0)
Hemoglobin: 13 g/dL (ref 13.0–17.0)
Immature Granulocytes: 0 %
Lymphocytes Relative: 26 %
Lymphs Abs: 2.7 10*3/uL (ref 0.7–4.0)
MCH: 27.8 pg (ref 26.0–34.0)
MCHC: 32.8 g/dL (ref 30.0–36.0)
MCV: 84.6 fL (ref 80.0–100.0)
Monocytes Absolute: 0.8 10*3/uL (ref 0.1–1.0)
Monocytes Relative: 8 %
Neutro Abs: 6.8 10*3/uL (ref 1.7–7.7)
Neutrophils Relative %: 64 %
Platelets: 261 10*3/uL (ref 150–400)
RBC: 4.68 MIL/uL (ref 4.22–5.81)
RDW: 13.3 % (ref 11.5–15.5)
WBC: 10.4 10*3/uL (ref 4.0–10.5)
nRBC: 0 % (ref 0.0–0.2)

## 2023-10-01 LAB — URINALYSIS, MICROSCOPIC (REFLEX): WBC, UA: NONE SEEN WBC/hpf (ref 0–5)

## 2023-10-01 LAB — TROPONIN I (HIGH SENSITIVITY): Troponin I (High Sensitivity): 4 ng/L (ref <18)

## 2023-10-01 LAB — COMPREHENSIVE METABOLIC PANEL
ALT: 35 U/L (ref 0–44)
AST: 24 U/L (ref 15–41)
Albumin: 4 g/dL (ref 3.5–5.0)
Alkaline Phosphatase: 77 U/L (ref 38–126)
Anion gap: 12 (ref 5–15)
BUN: 9 mg/dL (ref 6–20)
CO2: 24 mmol/L (ref 22–32)
Calcium: 8.9 mg/dL (ref 8.9–10.3)
Chloride: 102 mmol/L (ref 98–111)
Creatinine, Ser: 0.83 mg/dL (ref 0.61–1.24)
GFR, Estimated: >60 mL/min (ref >60)
Glucose, Bld: 156 mg/dL — ABNORMAL HIGH (ref 70–99)
Potassium: 4.4 mmol/L (ref 3.5–5.1)
Sodium: 138 mmol/L (ref 135–145)
Total Bilirubin: 0.7 mg/dL (ref 0.3–1.2)
Total Protein: 7.2 g/dL (ref 6.5–8.1)

## 2023-10-01 LAB — URINALYSIS, ROUTINE W REFLEX MICROSCOPIC
Bilirubin Urine: NEGATIVE
Glucose, UA: NEGATIVE mg/dL
Hgb urine dipstick: NEGATIVE
Ketones, ur: NEGATIVE mg/dL
Leukocytes,Ua: NEGATIVE
Nitrite: NEGATIVE
Protein, ur: 30 mg/dL — AB
Specific Gravity, Urine: >=1.03 (ref 1.005–1.030)
pH: 5.5 (ref 5.0–8.0)

## 2023-10-01 LAB — LIPASE, BLOOD: Lipase: 32 U/L (ref 11–51)

## 2023-10-01 LAB — CK: Total CK: 159 U/L (ref 49–397)

## 2023-10-01 MED ORDER — SODIUM CHLORIDE 0.9 % IV BOLUS
1000.0000 mL | Freq: Once | INTRAVENOUS | Status: AC
  Administered 2023-10-01: 1000 mL via INTRAVENOUS

## 2023-10-01 MED ORDER — METOPROLOL SUCCINATE ER 100 MG PO TB24: 100.0000 mg | ORAL_TABLET | Freq: Every day | ORAL | 0 refills | Status: AC

## 2023-10-01 MED ORDER — METOPROLOL SUCCINATE ER 25 MG PO TB24
100.0000 mg | ORAL_TABLET | Freq: Every day | ORAL | Status: DC
  Administered 2023-10-01: 100 mg via ORAL
  Filled 2023-10-01: qty 4

## 2023-10-01 NOTE — Discharge Instructions (Signed)
You were seen in the emerged part today with fatigue.  Your vital signs of improved with IV fluids.  I am refilling your metoprolol for the next week until you are able to get a longer-term refill on this medicine.  Please follow closely with your primary care doctor in the coming week for reassessment of symptoms and to guide further testing in the outpatient setting.

## 2023-10-01 NOTE — ED Provider Notes (Signed)
Emergency Department Provider Note   I have reviewed the triage vital signs and the nursing notes.   HISTORY  Chief Complaint Fatigue   HPI Johnathan Steele is a 47 y.o. male past history diabetes, hypertension, OSA presents to the emergency department valuation of generalized weakness/fatigue.  Symptoms been worsening over the past 4 days.  He is had some associated muscle aches and soreness.  No fevers or chills.  No injuries.  No new workout routines, supplements, medication changes.  Denies any specific unilateral weakness or numbness.  No severe headaches.  He has some chronic low back and neck pain which is unchanged.  No known tick exposures. No vision changes.  He has been compliant with his medications except for metoprolol which she ran out of 2 days ago and has called for refills.    Past Medical History:  Diagnosis Date   Depression    Diabetes mellitus    Hypertension    Migraine    Migraine with aura 09/08/2013   Migraines    OSA (obstructive sleep apnea) 09/08/2013    Review of Systems  Constitutional: No fever/chills. Positive generalized weakness.  Cardiovascular: Denies chest pain. Respiratory: Denies shortness of breath. Gastrointestinal: No abdominal pain.  No nausea, no vomiting.  No diarrhea.  No constipation. Genitourinary: Negative for dysuria. Musculoskeletal: Chronic neck and back pain.  Skin: Negative for rash. Neurological: Negative for headaches, focal weakness or numbness.   ____________________________________________   PHYSICAL EXAM:  VITAL SIGNS: ED Triage Vitals  Encounter Vitals Group     BP 10/01/23 1628 (!) 169/102     Pulse Rate 10/01/23 1628 (!) 125     Resp 10/01/23 1628 18     Temp 10/01/23 1628 97.8 F (36.6 C)     Temp src --      SpO2 10/01/23 1628 99 %     Weight 10/01/23 1628 280 lb (127 kg)     Height 10/01/23 1628 6\' 1"  (1.854 m)   Constitutional: Alert and oriented. Well appearing and in no acute  distress. Eyes: Conjunctivae are normal. Head: Atraumatic. Nose: No congestion/rhinnorhea. Mouth/Throat: Mucous membranes are moist.  Neck: No stridor.   Cardiovascular: Normal rate, regular rhythm. Good peripheral circulation. Grossly normal heart sounds.   Respiratory: Normal respiratory effort.  No retractions. Lungs CTAB. Gastrointestinal: Soft and nontender. No distention.  Musculoskeletal: No gross deformities of extremities. Neurologic:  Normal speech and language. No gross focal neurologic deficits are appreciated. No facial asymmetry. 5/5 strength in the bilateral upper/lower extremities. No drift. Normal sensation throughout.  Skin:  Skin is warm, dry and intact. No rash noted.   ____________________________________________   LABS (all labs ordered are listed, but only abnormal results are displayed)  Labs Reviewed  URINALYSIS, ROUTINE W REFLEX MICROSCOPIC - Abnormal; Notable for the following components:      Result Value   Protein, ur 30 (*)    All other components within normal limits  COMPREHENSIVE METABOLIC PANEL - Abnormal; Notable for the following components:   Glucose, Bld 156 (*)    All other components within normal limits  URINALYSIS, MICROSCOPIC (REFLEX) - Abnormal; Notable for the following components:   Bacteria, UA RARE (*)    All other components within normal limits  LIPASE, BLOOD  CBC WITH DIFFERENTIAL/PLATELET  CK  TROPONIN I (HIGH SENSITIVITY)  TROPONIN I (HIGH SENSITIVITY)   ____________________________________________  EKG   EKG Interpretation Date/Time:  Tuesday October 01 2023 16:32:02 EDT Ventricular Rate:  122 PR Interval:  144 QRS Duration:  94 QT Interval:  336 QTC Calculation: 478 R Axis:   89  Text Interpretation: Sinus tachycardia Cannot rule out Anterior infarct , age undetermined Abnormal ECG When compared with ECG of 14-Apr-2023 19:22, PREVIOUS ECG IS PRESENT Confirmed by Alona Bene [phone]) on 10/01/2023 5:02:03 PM        ____________________________________________   PROCEDURES  Procedure(s) performed:   Procedures  None  ____________________________________________   INITIAL IMPRESSION / ASSESSMENT AND PLAN / ED COURSE  Pertinent labs & imaging results that were available during my care of the patient were reviewed by me and considered in my medical decision making (see chart for details).   This patient is Presenting for Evaluation of weakness, which does require a range of treatment options, and is a complaint that involves a high risk of morbidity and mortality.  The Differential Diagnoses include AKI, dehydration, anemia, electrolyte disturbance, medication side effect etc.  Critical Interventions-    Medications  metoprolol succinate (TOPROL-XL) 24 hr tablet 100 mg (100 mg Oral Given 10/01/23 1904)  sodium chloride 0.9 % bolus 1,000 mL (0 mLs Intravenous Stopped 10/01/23 1905)    Reassessment after intervention:  symptoms improved. HR improved.   I decided to review pertinent External Data, and in summary COVID and Flu negative at [facility] today.   Clinical Laboratory Tests Ordered, included CBC without leukocytosis or anemia. CK WNL. No AKI.   Radiologic Tests: No focal neurodeficits.  Defer neuroimaging for now.  Cardiac Monitor Tracing which shows sinus tachycardia.    Social Determinants of Health Risk patient is a non-smoker.   Medical Decision Making: Summary:  Patient presents emergency department with fatigue.  Arrives with tachycardia but no fever or other infection symptoms.  Doubt sepsis.  Plan for IV fluids, home metoprolol, screening lab work and reassess.  Reevaluation with update and discussion with heart rate downtrending nicely with IV fluids.  He is feeling improved.  Will give his home dose of metoprolol along with a 1 week supply sent to his pharmacy as he recently ran out of this medication.  He is calling for refills from his PCP but that may take several days.     Patient's presentation is most consistent with acute presentation with potential threat to life or bodily function.   Disposition: discharge  ____________________________________________  FINAL CLINICAL IMPRESSION(S) / ED DIAGNOSES  Final diagnoses:  Weakness  Dehydration     NEW OUTPATIENT MEDICATIONS STARTED DURING THIS VISIT:  Discharge Medication List as of 10/01/2023  7:21 PM     START taking these medications   Details  !! metoprolol succinate (TOPROL-XL) 100 MG 24 hr tablet Take 1 tablet (100 mg total) by mouth daily for 7 days., Starting Tue 10/01/2023, Until Tue 10/08/2023, Normal     !! - Potential duplicate medications found. Please discuss with provider.      Note:  This document was prepared using Dragon voice recognition software and may include unintentional dictation errors.  Alona Bene, MD, [facility]    Lashaun Krapf, Arlyss Repress, MD 10/01/23 2034

## 2023-10-01 NOTE — ED Triage Notes (Signed)
Pt with c/o fatigue and body soreness x 4d; was seen at [facility] today and negative for Covid and Flu A,B; CBC, BMP results also available; tachycardic; c/p lower back and neck pain

## 2023-10-01 NOTE — ED Notes (Signed)
Pt reports being "weak and tired"

## 2023-11-27 ENCOUNTER — Other Ambulatory Visit: Payer: Self-pay

## 2023-11-27 ENCOUNTER — Emergency Department (HOSPITAL_BASED_OUTPATIENT_CLINIC_OR_DEPARTMENT_OTHER): Payer: Managed Care, Other (non HMO)

## 2023-11-27 ENCOUNTER — Encounter (HOSPITAL_BASED_OUTPATIENT_CLINIC_OR_DEPARTMENT_OTHER): Payer: Self-pay | Admitting: Emergency Medicine

## 2023-11-27 ENCOUNTER — Emergency Department (HOSPITAL_BASED_OUTPATIENT_CLINIC_OR_DEPARTMENT_OTHER)
Admission: EM | Admit: 2023-11-27 | Discharge: 2023-11-27 | Disposition: A | Discharge status: Home / Self Care | Payer: Managed Care, Other (non HMO) | Attending: Emergency Medicine | Admitting: Emergency Medicine

## 2023-11-27 DIAGNOSIS — S8002XA Contusion of left knee, initial encounter: Secondary | ICD-10-CM | POA: Diagnosis not present

## 2023-11-27 DIAGNOSIS — Y92410 Unspecified street and highway as the place of occurrence of the external cause: Secondary | ICD-10-CM | POA: Diagnosis not present

## 2023-11-27 DIAGNOSIS — Z79899 Other long term (current) drug therapy: Secondary | ICD-10-CM | POA: Diagnosis not present

## 2023-11-27 DIAGNOSIS — M25562 Pain in left knee: Secondary | ICD-10-CM | POA: Insufficient documentation

## 2023-11-27 DIAGNOSIS — I10 Essential (primary) hypertension: Secondary | ICD-10-CM | POA: Insufficient documentation

## 2023-11-27 DIAGNOSIS — Z7984 Long term (current) use of oral hypoglycemic drugs: Secondary | ICD-10-CM | POA: Insufficient documentation

## 2023-11-27 DIAGNOSIS — S161XXA Strain of muscle, fascia and tendon at neck level, initial encounter: Secondary | ICD-10-CM | POA: Insufficient documentation

## 2023-11-27 DIAGNOSIS — S199XXA Unspecified injury of neck, initial encounter: Secondary | ICD-10-CM | POA: Diagnosis present

## 2023-11-27 DIAGNOSIS — E119 Type 2 diabetes mellitus without complications: Secondary | ICD-10-CM | POA: Diagnosis not present

## 2023-11-27 MED ORDER — CYCLOBENZAPRINE HCL 10 MG PO TABS
10.0000 mg | ORAL_TABLET | Freq: Once | ORAL | Status: AC
  Administered 2023-11-27: 10 mg via ORAL
  Filled 2023-11-27: qty 1

## 2023-11-27 MED ORDER — IBUPROFEN 400 MG PO TABS
600.0000 mg | ORAL_TABLET | Freq: Once | ORAL | Status: AC
  Administered 2023-11-27: 600 mg via ORAL
  Filled 2023-11-27: qty 1

## 2023-11-27 MED ORDER — CYCLOBENZAPRINE HCL 10 MG PO TABS: 10.0000 mg | ORAL_TABLET | Freq: Two times a day (BID) | ORAL | 0 refills | Status: AC | PRN
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
## 2024-12-09 ENCOUNTER — Emergency Department (HOSPITAL_BASED_OUTPATIENT_CLINIC_OR_DEPARTMENT_OTHER)
Admission: EM | Admit: 2024-12-09 | Discharge: 2024-12-09 | Disposition: A | Discharge status: Home / Self Care | Attending: Emergency Medicine | Admitting: Emergency Medicine

## 2024-12-09 ENCOUNTER — Other Ambulatory Visit: Payer: Self-pay

## 2024-12-09 ENCOUNTER — Emergency Department (HOSPITAL_BASED_OUTPATIENT_CLINIC_OR_DEPARTMENT_OTHER)

## 2024-12-09 ENCOUNTER — Encounter (HOSPITAL_BASED_OUTPATIENT_CLINIC_OR_DEPARTMENT_OTHER): Payer: Self-pay | Admitting: Emergency Medicine

## 2024-12-09 DIAGNOSIS — J069 Acute upper respiratory infection, unspecified: Secondary | ICD-10-CM | POA: Diagnosis not present

## 2024-12-09 DIAGNOSIS — R059 Cough, unspecified: Secondary | ICD-10-CM | POA: Diagnosis present

## 2024-12-09 MED ORDER — PREDNISONE 20 MG PO TABS
40.0000 mg | ORAL_TABLET | Freq: Once | ORAL | Status: AC
  Administered 2024-12-09: 40 mg via ORAL
  Filled 2024-12-09: qty 2

## 2024-12-09 MED ORDER — PREDNISONE 10 MG PO TABS: 40.0000 mg | ORAL_TABLET | Freq: Every day | ORAL | 0 refills | Status: AC

## 2024-12-09 MED ORDER — ALBUTEROL SULFATE HFA 108 (90 BASE) MCG/ACT IN AERS
2.0000 | INHALATION_SPRAY | Freq: Once | RESPIRATORY_TRACT | Status: AC
  Administered 2024-12-09: 2 via RESPIRATORY_TRACT
  Filled 2024-12-09: qty 6.7

## 2024-12-09 NOTE — ED Notes (Signed)
 ED Provider at bedside.

## 2024-12-09 NOTE — ED Provider Notes (Signed)
 " [place] [facility] Provider Note   CSN: [id number] Arrival date & time: 12/09/24  9148     Patient presents with: Cough   Johnathan Steele is a 49 y.o. male senting to the ED with about 5 days of coughing and congestion.  Patient denies smoking history, asthma history, denies fevers or chills, denies sick contacts in the house.  He says he has felt fatigued and has had a dry persistent cough for 5 days.  He went to urgent care and was given a cough medication.   HPI     Prior to Admission medications  Medication Sig Start Date End Date Taking? Authorizing Provider  predniSONE  (DELTASONE ) 10 MG tablet Take 4 tablets (40 mg total) by mouth daily with breakfast for 4 days. 12/10/24 12/14/24 Yes Holley Kocurek, Donnice PARAS, MD  AMLODIPINE BESYLATE PO Take by mouth.    [provider]  azithromycin  (ZITHROMAX  Z-PAK) 250 MG tablet 2 po day one, then 1 daily x 4 days 01/28/18   Patt Alm Macho, MD  buPROPion  (WELLBUTRIN  XL) 150 MG 24 hr tablet Take 1 tablet (150 mg total) by mouth daily. For depression 08/10/16   Collene Gouge I, NP  Cariprazine  HCl (VRAYLAR ) 1.5 MG CAPS Take 2 capsules (3 mg total) by mouth daily. For mood control 08/10/16   Collene Gouge I, NP  cyclobenzaprine  (FLEXERIL ) 10 MG tablet Take 1 tablet (10 mg total) by mouth 2 (two) times daily as needed for muscle spasms. 11/27/23   Griselda Norris, MD  FLUoxetine  (PROZAC ) 20 MG capsule Take 1 capsule (20 mg total) by mouth daily. For depression 08/10/16   Collene Gouge I, NP  glipiZIDE  (GLUCOTROL  XL) 10 MG 24 hr tablet Take 1 tablet (10 mg total) by mouth daily with breakfast. 08/19/16   Eldridge, Serena Y, PA-C  hydrOXYzine  (ATARAX /VISTARIL ) 50 MG tablet Take 1 tablet (50 mg total) by mouth every 6 (six) hours as needed for anxiety. 08/10/16   Collene Gouge I, NP  loperamide  (IMODIUM ) 2 MG capsule Take 1 capsule (2 mg total) by mouth 4 (four) times daily as needed for diarrhea or loose stools. 02/07/21    Randol Simmonds, MD  losartan  (COZAAR ) 100 MG tablet Take 1 tablet (100 mg total) by mouth daily. For high blood pressure 08/10/16   Collene Gouge I, NP  metFORMIN  (GLUCOPHAGE ) 1000 MG tablet Take 1 tablet (1,000 mg total) by mouth 2 (two) times daily with a meal. For diabetes 08/10/16   Collene Gouge I, NP  metoprolol  succinate (TOPROL -XL) 100 MG 24 hr tablet Take 1 tablet (100 mg total) by mouth every 12 (twelve) hours. For high blood pressure 08/10/16   Nwoko, Gouge I, NP  metoprolol  succinate (TOPROL -XL) 100 MG 24 hr tablet Take 1 tablet (100 mg total) by mouth daily for 7 days. 10/01/23 10/08/23  Long, Joshua G, MD  ondansetron  (ZOFRAN  ODT) 8 MG disintegrating tablet Take 1 tablet (8 mg total) by mouth every 8 (eight) hours as needed for nausea or vomiting. 02/07/21   Randol Simmonds, MD  ondansetron  (ZOFRAN -ODT) 4 MG disintegrating tablet Take 1 tablet (4 mg total) by mouth every 8 (eight) hours as needed for nausea or vomiting. 04/14/23   Dreama Longs, MD  pantoprazole  (PROTONIX ) 40 MG tablet Take 1 tablet (40 mg total) by mouth daily for 14 days. 04/14/23 04/28/23  Dreama Longs, MD  traZODone  (DESYREL ) 50 MG tablet Take 1 tablet (50 mg total) by mouth at bedtime as needed for sleep.  08/10/16   Collene Gouge I, NP    Allergies: Sumatriptan succinate and Topamax  [topiramate ]    Review of Systems  Updated Vital Signs BP (!) 145/102   Pulse 92   Temp 98.1 F (36.7 C)   Resp 20   Wt 122.5 kg   SpO2 100%   BMI 35.62 kg/m   Physical Exam Constitutional:      General: He is not in acute distress.    Appearance: He is obese.  HENT:     Head: Normocephalic and atraumatic.  Eyes:     Conjunctiva/sclera: Conjunctivae normal.     Pupils: Pupils are equal, round, and reactive to light.  Cardiovascular:     Rate and Rhythm: Normal rate and regular rhythm.  Pulmonary:     Effort: Pulmonary effort is normal. No respiratory distress.     Breath sounds: Normal breath sounds. No stridor. No wheezing or  rales.  Skin:    General: Skin is warm and dry.  Neurological:     General: No focal deficit present.     Mental Status: He is alert. Mental status is at baseline.  Psychiatric:        Mood and Affect: Mood normal.        Behavior: Behavior normal.     (all labs ordered are listed, but only abnormal results are displayed) Labs Reviewed - No data to display  EKG: None  Radiology: DG Chest 2 View Result Date: 12/09/2024 CLINICAL DATA:  Cough and shortness of breath for 5 days EXAM: CHEST - 2 VIEW COMPARISON:  November 27, 2023 FINDINGS: The heart size and mediastinal contours are within normal limits. Both lungs are clear. The visualized skeletal structures are unremarkable. IMPRESSION: No active cardiopulmonary disease. Electronically Signed   By: Lynwood Landy Raddle M.D.   On: 12/09/2024 09:32     Procedures   Medications Ordered in the ED  albuterol  (VENTOLIN  HFA) 108 (90 Base) MCG/ACT inhaler 2 puff (has no administration in time range)  predniSONE  (DELTASONE ) tablet 40 mg (has no administration in time range)                                    Medical Decision Making Amount and/or Complexity of Data Reviewed Radiology: ordered.  Risk Prescription drug management.   This is a well-appearing patient here with a dry cough ongoing for about 5 days.  He has no hypoxia or evidence of respiratory distress.  Lungs are clear without wheezing.  I personally reviewed his chest x-ray with no focal infiltrate.  He is afebrile.  Doubt sepsis.  No further workup needed at this time.  No indication for antibiotics.  Suspect is likely a mild viral syndrome.  We can try albuterol  inhaler and a short course of prednisone , which may help with some early bronchitis or bronchospasm type complaint.  But he is stable otherwise for discharge.     Final diagnoses:  Viral URI with cough    ED Discharge Orders          Ordered    predniSONE  (DELTASONE ) 10 MG tablet  Daily with breakfast         12/09/24 0959               Cottie Donnice PARAS, MD 12/09/24 1001  "

## 2024-12-09 NOTE — Discharge Instructions (Addendum)
 You can use the albuterol  inhaler taking 1 to 2 puffs, every 4 hours as needed for wheezing or shortness of breath.

## 2024-12-09 NOTE — ED Triage Notes (Signed)
 Cough x 5 days , shortness of breath x 5 days , fatigue , denies chest pain .
